# Patient Record
Sex: Male | Born: 1960
Health system: Southern US, Community
[De-identification: ages and names within clinical notes are randomized; demographics above are authoritative.]

## PROBLEM LIST (undated history)

## (undated) DIAGNOSIS — I213 ST elevation (STEMI) myocardial infarction of unspecified site: Secondary | ICD-10-CM

## (undated) DIAGNOSIS — E119 Type 2 diabetes mellitus without complications: Secondary | ICD-10-CM

## (undated) DIAGNOSIS — E785 Hyperlipidemia, unspecified: Secondary | ICD-10-CM

## (undated) DIAGNOSIS — Z8249 Family history of ischemic heart disease and other diseases of the circulatory system: Secondary | ICD-10-CM

## (undated) DIAGNOSIS — I10 Essential (primary) hypertension: Secondary | ICD-10-CM

## (undated) HISTORY — DX: Hyperlipidemia, unspecified: E78.5

## (undated) HISTORY — DX: Essential (primary) hypertension: I10

## (undated) HISTORY — PX: CORONARY STENT PLACEMENT: SHX1402

---

## 2011-12-03 DIAGNOSIS — I2699 Other pulmonary embolism without acute cor pulmonale: Secondary | ICD-10-CM

## 2014-05-11 HISTORY — PX: INGUINAL HERNIA REPAIR: SUR1180

## 2017-06-22 ENCOUNTER — Inpatient Hospital Stay (HOSPITAL_COMMUNITY)
Admission: EM | Disposition: A | Discharge status: Home / Self Care | Payer: Self-pay | Source: Ambulatory Visit | Attending: Cardiovascular Disease

## 2017-06-22 ENCOUNTER — Encounter (HOSPITAL_COMMUNITY): Payer: Self-pay | Admitting: Cardiology

## 2017-06-22 ENCOUNTER — Inpatient Hospital Stay (HOSPITAL_COMMUNITY)
Admission: EM | Admit: 2017-06-22 | Discharge: 2017-06-24 | DRG: 247 | Disposition: A | Discharge status: Home / Self Care | Payer: PRIVATE HEALTH INSURANCE | Source: Ambulatory Visit | Attending: Cardiovascular Disease | Admitting: Cardiovascular Disease

## 2017-06-22 DIAGNOSIS — Z885 Allergy status to narcotic agent status: Secondary | ICD-10-CM

## 2017-06-22 DIAGNOSIS — Z7982 Long term (current) use of aspirin: Secondary | ICD-10-CM

## 2017-06-22 DIAGNOSIS — I2510 Atherosclerotic heart disease of native coronary artery without angina pectoris: Secondary | ICD-10-CM

## 2017-06-22 DIAGNOSIS — Z79899 Other long term (current) drug therapy: Secondary | ICD-10-CM

## 2017-06-22 DIAGNOSIS — I25110 Atherosclerotic heart disease of native coronary artery with unstable angina pectoris: Secondary | ICD-10-CM | POA: Diagnosis present

## 2017-06-22 DIAGNOSIS — E785 Hyperlipidemia, unspecified: Secondary | ICD-10-CM | POA: Diagnosis present

## 2017-06-22 DIAGNOSIS — I2119 ST elevation (STEMI) myocardial infarction involving other coronary artery of inferior wall: Secondary | ICD-10-CM | POA: Diagnosis present

## 2017-06-22 DIAGNOSIS — Z8249 Family history of ischemic heart disease and other diseases of the circulatory system: Secondary | ICD-10-CM | POA: Diagnosis not present

## 2017-06-22 DIAGNOSIS — I2121 ST elevation (STEMI) myocardial infarction involving left circumflex coronary artery: Secondary | ICD-10-CM | POA: Diagnosis not present

## 2017-06-22 DIAGNOSIS — Z955 Presence of coronary angioplasty implant and graft: Secondary | ICD-10-CM

## 2017-06-22 HISTORY — PX: LEFT HEART CATH AND CORONARY ANGIOGRAPHY: CATH118249

## 2017-06-22 HISTORY — PX: CORONARY/GRAFT ACUTE MI REVASCULARIZATION: CATH118305

## 2017-06-22 HISTORY — DX: Hyperlipidemia, unspecified: E78.5

## 2017-06-22 HISTORY — DX: ST elevation (STEMI) myocardial infarction of unspecified site: I21.3

## 2017-06-22 HISTORY — DX: Family history of ischemic heart disease and other diseases of the circulatory system: Z82.49

## 2017-06-22 LAB — COMPREHENSIVE METABOLIC PANEL
ALBUMIN: 4.2 g/dL (ref 3.5–5.0)
ALK PHOS: 60 U/L (ref 38–126)
ALT: 54 U/L (ref 17–63)
AST: 120 U/L — AB (ref 15–41)
Anion gap: 12 (ref 5–15)
BILIRUBIN TOTAL: 0.8 mg/dL (ref 0.3–1.2)
BUN: 9 mg/dL (ref 6–20)
CO2: 20 mmol/L — AB (ref 22–32)
Calcium: 9.4 mg/dL (ref 8.9–10.3)
Chloride: 109 mmol/L (ref 101–111)
Creatinine, Ser: 0.77 mg/dL (ref 0.61–1.24)
GFR calc Af Amer: >60 mL/min (ref >60)
GFR calc non Af Amer: >60 mL/min (ref >60)
Glucose, Bld: 120 mg/dL — ABNORMAL HIGH (ref 65–99)
POTASSIUM: 3.9 mmol/L (ref 3.5–5.1)
SODIUM: 141 mmol/L (ref 135–145)
Total Protein: 7 g/dL (ref 6.5–8.1)

## 2017-06-22 LAB — LIPID PANEL
CHOLESTEROL: 150 mg/dL (ref 0–200)
HDL: 54 mg/dL (ref >40)
LDL Cholesterol: 72 mg/dL (ref 0–99)
TRIGLYCERIDES: 118 mg/dL (ref <150)
Total CHOL/HDL Ratio: 2.8 RATIO
VLDL: 24 mg/dL (ref 0–40)

## 2017-06-22 LAB — HEMOGLOBIN A1C
Hgb A1c MFr Bld: 6.7 % — ABNORMAL HIGH (ref 4.8–5.6)
Mean Plasma Glucose: 145.59 mg/dL

## 2017-06-22 LAB — CBC
HEMATOCRIT: 43.5 % (ref 39.0–52.0)
Hemoglobin: 15.2 g/dL (ref 13.0–17.0)
MCH: 30.4 pg (ref 26.0–34.0)
MCHC: 34.9 g/dL (ref 30.0–36.0)
MCV: 87 fL (ref 78.0–100.0)
Platelets: 235 10*3/uL (ref 150–400)
RBC: 5 MIL/uL (ref 4.22–5.81)
RDW: 13.2 % (ref 11.5–15.5)
WBC: 8.6 10*3/uL (ref 4.0–10.5)

## 2017-06-22 LAB — POCT I-STAT, CHEM 8
BUN: 10 mg/dL (ref 6–20)
CALCIUM ION: 1.24 mmol/L (ref 1.15–1.40)
Chloride: 104 mmol/L (ref 101–111)
Creatinine, Ser: 0.7 mg/dL (ref 0.61–1.24)
GLUCOSE: 186 mg/dL — AB (ref 65–99)
HCT: 43 % (ref 39.0–52.0)
Hemoglobin: 14.6 g/dL (ref 13.0–17.0)
Potassium: 3.8 mmol/L (ref 3.5–5.1)
SODIUM: 140 mmol/L (ref 135–145)
TCO2: 21 mmol/L — AB (ref 22–32)

## 2017-06-22 LAB — TROPONIN I
TROPONIN I: 19.28 ng/mL — AB (ref <0.03)
Troponin I: 38.49 ng/mL (ref <0.03)

## 2017-06-22 LAB — APTT: aPTT: 96 seconds — ABNORMAL HIGH (ref 24–36)

## 2017-06-22 LAB — MRSA PCR SCREENING: MRSA by PCR: NEGATIVE

## 2017-06-22 LAB — PROTIME-INR
INR: 1.06
Prothrombin Time: 13.7 seconds (ref 11.4–15.2)

## 2017-06-22 LAB — POCT ACTIVATED CLOTTING TIME
ACTIVATED CLOTTING TIME: 296 s
Activated Clotting Time: 191 seconds

## 2017-06-22 LAB — BRAIN NATRIURETIC PEPTIDE: B Natriuretic Peptide: 90.1 pg/mL (ref 0.0–100.0)

## 2017-06-22 SURGERY — LEFT HEART CATH AND CORONARY ANGIOGRAPHY
Anesthesia: LOCAL

## 2017-06-22 MED ORDER — TICAGRELOR 90 MG PO TABS
90.0000 mg | ORAL_TABLET | Freq: Two times a day (BID) | ORAL | Status: DC
  Administered 2017-06-22 – 2017-06-24 (×4): 90 mg via ORAL
  Filled 2017-06-22 (×4): qty 1

## 2017-06-22 MED ORDER — HEPARIN SODIUM (PORCINE) 5000 UNIT/ML IJ SOLN
5000.0000 [IU] | Freq: Three times a day (TID) | INTRAMUSCULAR | Status: DC
  Administered 2017-06-22 – 2017-06-24 (×5): 5000 [IU] via SUBCUTANEOUS
  Filled 2017-06-22 (×5): qty 1

## 2017-06-22 MED ORDER — ATORVASTATIN CALCIUM 80 MG PO TABS
80.0000 mg | ORAL_TABLET | Freq: Every day | ORAL | Status: DC
  Administered 2017-06-22 – 2017-06-23 (×2): 80 mg via ORAL
  Filled 2017-06-22 (×2): qty 1

## 2017-06-22 MED ORDER — HEPARIN SODIUM (PORCINE) 1000 UNIT/ML IJ SOLN
INTRAMUSCULAR | Status: AC
  Filled 2017-06-22: qty 1

## 2017-06-22 MED ORDER — ATORVASTATIN CALCIUM 80 MG PO TABS: 80.0000 mg | ORAL_TABLET | Freq: Every day | ORAL | Status: DC

## 2017-06-22 MED ORDER — VERAPAMIL HCL 2.5 MG/ML IV SOLN
INTRAVENOUS | Status: AC
  Filled 2017-06-22: qty 2

## 2017-06-22 MED ORDER — NITROGLYCERIN 1 MG/10 ML FOR IR/CATH LAB
INTRA_ARTERIAL | Status: AC
  Filled 2017-06-22: qty 10

## 2017-06-22 MED ORDER — SODIUM CHLORIDE 0.9% FLUSH: 3.0000 mL | INTRAVENOUS | Status: DC | PRN

## 2017-06-22 MED ORDER — NITROGLYCERIN 0.4 MG SL SUBL: 0.4000 mg | SUBLINGUAL_TABLET | SUBLINGUAL | Status: DC | PRN

## 2017-06-22 MED ORDER — ONDANSETRON HCL 4 MG/2ML IJ SOLN: 4.0000 mg | Freq: Four times a day (QID) | INTRAMUSCULAR | Status: DC | PRN

## 2017-06-22 MED ORDER — ACETAMINOPHEN 325 MG PO TABS: 650.0000 mg | ORAL_TABLET | ORAL | Status: DC | PRN

## 2017-06-22 MED ORDER — TIROFIBAN HCL IN NACL 5-0.9 MG/100ML-% IV SOLN
INTRAVENOUS | Status: AC
  Filled 2017-06-22: qty 100

## 2017-06-22 MED ORDER — TICAGRELOR 90 MG PO TABS
ORAL_TABLET | ORAL | Status: AC
  Filled 2017-06-22: qty 2

## 2017-06-22 MED ORDER — LIDOCAINE HCL (PF) 1 % IJ SOLN
INTRAMUSCULAR | Status: DC | PRN
  Administered 2017-06-22: 2 mL

## 2017-06-22 MED ORDER — SODIUM CHLORIDE 0.9 % WEIGHT BASED INFUSION
1.0000 mL/kg/h | INTRAVENOUS | Status: AC
  Administered 2017-06-22: 1 mL/kg/h via INTRAVENOUS

## 2017-06-22 MED ORDER — IOPAMIDOL (ISOVUE-370) INJECTION 76%
INTRAVENOUS | Status: AC
  Filled 2017-06-22: qty 50

## 2017-06-22 MED ORDER — SODIUM CHLORIDE 0.9 % WEIGHT BASED INFUSION: 1.0000 mL/kg/h | INTRAVENOUS | Status: DC

## 2017-06-22 MED ORDER — IOPAMIDOL (ISOVUE-370) INJECTION 76%
INTRAVENOUS | Status: AC
  Filled 2017-06-22: qty 100

## 2017-06-22 MED ORDER — TICAGRELOR 90 MG PO TABS
ORAL_TABLET | ORAL | Status: DC | PRN
  Administered 2017-06-22: 180 mg via ORAL

## 2017-06-22 MED ORDER — VERAPAMIL HCL 2.5 MG/ML IV SOLN
INTRAVENOUS | Status: DC | PRN
  Administered 2017-06-22: 10 mL via INTRA_ARTERIAL

## 2017-06-22 MED ORDER — SODIUM CHLORIDE 0.9% FLUSH
3.0000 mL | Freq: Two times a day (BID) | INTRAVENOUS | Status: DC
  Administered 2017-06-23 (×2): 3 mL via INTRAVENOUS

## 2017-06-22 MED ORDER — MIDAZOLAM HCL 2 MG/2ML IJ SOLN
INTRAMUSCULAR | Status: DC | PRN
  Administered 2017-06-22: 2 mg via INTRAVENOUS

## 2017-06-22 MED ORDER — FENTANYL CITRATE (PF) 100 MCG/2ML IJ SOLN
INTRAMUSCULAR | Status: DC | PRN
  Administered 2017-06-22: 25 ug via INTRAVENOUS

## 2017-06-22 MED ORDER — HEPARIN (PORCINE) IN NACL 2-0.9 UNIT/ML-% IJ SOLN
INTRAMUSCULAR | Status: AC
  Filled 2017-06-22: qty 1000

## 2017-06-22 MED ORDER — TIROFIBAN (AGGRASTAT) BOLUS VIA INFUSION
INTRAVENOUS | Status: DC | PRN
  Administered 2017-06-22: 2574.175 ug via INTRAVENOUS

## 2017-06-22 MED ORDER — ASPIRIN 81 MG PO CHEW
81.0000 mg | CHEWABLE_TABLET | Freq: Every day | ORAL | Status: DC
  Administered 2017-06-23 – 2017-06-24 (×2): 81 mg via ORAL
  Filled 2017-06-22 (×2): qty 1

## 2017-06-22 MED ORDER — FENTANYL CITRATE (PF) 100 MCG/2ML IJ SOLN
INTRAMUSCULAR | Status: AC
  Filled 2017-06-22: qty 2

## 2017-06-22 MED ORDER — HYDRALAZINE HCL 20 MG/ML IJ SOLN
5.0000 mg | INTRAMUSCULAR | Status: AC | PRN
  Administered 2017-06-22: 5 mg via INTRAVENOUS
  Filled 2017-06-22: qty 1

## 2017-06-22 MED ORDER — HEPARIN (PORCINE) IN NACL 2-0.9 UNIT/ML-% IJ SOLN
INTRAMUSCULAR | Status: AC | PRN
  Administered 2017-06-22: 1000 mL via INTRA_ARTERIAL

## 2017-06-22 MED ORDER — MIDAZOLAM HCL 2 MG/2ML IJ SOLN
INTRAMUSCULAR | Status: AC
  Filled 2017-06-22: qty 2

## 2017-06-22 MED ORDER — IOPAMIDOL (ISOVUE-370) INJECTION 76%
INTRAVENOUS | Status: DC | PRN
  Administered 2017-06-22: 111 mL via INTRA_ARTERIAL

## 2017-06-22 MED ORDER — HEPARIN SODIUM (PORCINE) 1000 UNIT/ML IJ SOLN
INTRAMUSCULAR | Status: DC | PRN
  Administered 2017-06-22: 4000 [IU] via INTRAVENOUS

## 2017-06-22 MED ORDER — LIDOCAINE HCL (PF) 1 % IJ SOLN
INTRAMUSCULAR | Status: AC
  Filled 2017-06-22: qty 30

## 2017-06-22 MED ORDER — LABETALOL HCL 5 MG/ML IV SOLN: 10.0000 mg | INTRAVENOUS | Status: AC | PRN

## 2017-06-22 MED ORDER — SODIUM CHLORIDE 0.9 % IV SOLN: 250.0000 mL | INTRAVENOUS | Status: DC | PRN

## 2017-06-22 MED ORDER — ASPIRIN 325 MG PO TABS
ORAL_TABLET | ORAL | Status: DC | PRN
  Administered 2017-06-22: 325 mg via ORAL

## 2017-06-22 MED ORDER — TIROFIBAN HCL IN NACL 5-0.9 MG/100ML-% IV SOLN
INTRAVENOUS | Status: DC | PRN
  Administered 2017-06-22: 0.075 ug/kg/min via INTRAVENOUS

## 2017-06-22 SURGICAL SUPPLY — 20 items
BALLN SAPPHIRE 2.5X12 (BALLOONS) ×2
BALLOON SAPPHIRE 2.5X12 (BALLOONS) ×1 IMPLANT
CATH 5FR JL3.5 JR4 ANG PIG MP (CATHETERS) ×2 IMPLANT
CATH LAUNCHER 6FR EBU3.5 (CATHETERS) ×2 IMPLANT
DEVICE RAD COMP TR BAND LRG (VASCULAR PRODUCTS) ×2 IMPLANT
ELECT DEFIB PAD ADLT CADENCE (PAD) ×2 IMPLANT
GLIDESHEATH SLEND SS 6F .021 (SHEATH) ×2 IMPLANT
GUIDEWIRE INQWIRE 1.5J.035X260 (WIRE) ×1 IMPLANT
INQWIRE 1.5J .035X260CM (WIRE) ×2
KIT ENCORE 26 ADVANTAGE (KITS) ×2 IMPLANT
KIT HEART LEFT (KITS) ×2 IMPLANT
KIT PREMIUM HAND CONTROLLER (KITS) ×2 IMPLANT
KIT SINGLE USE MANIFOLD (KITS) ×2 IMPLANT
PACK CARDIAC CATHETERIZATION (CUSTOM PROCEDURE TRAY) ×2 IMPLANT
PROTECTION STATION PRESSURIZED (MISCELLANEOUS) ×2
STATION PROTECTION PRESSURIZED (MISCELLANEOUS) ×1 IMPLANT
STENT RESOLUTE ONYX 3.0X15 (Permanent Stent) ×2 IMPLANT
TUBING CIL FLEX 10 FLL-RA (TUBING) ×2 IMPLANT
WIRE COUGAR XT STRL 190CM (WIRE) ×2 IMPLANT
WIRE HI TORQ WHISPER MS 190CM (WIRE) ×2 IMPLANT

## 2017-06-22 NOTE — H&P (Signed)
Cardiology Admission History and Physical:   Patient ID: Daniel Mcgee; MRN: [id number]; DOB: 1960-08-21   Admission date: 06/22/2017  Primary Care Provider: Caryl Bis, MD Primary Cardiologist: Sherren Mocha, MD   Chief Complaint:  Chest Pain; CODE STEMI  Patient Profile:   Daniel Mcgee is a 57 y.o. male with a history of HLD and strong family h/o premature CAD, presenting to [facility] from OSH with chest pain and inferior STEMI.   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 He has a h/o HLD, treated with Zocor. He also has a strong family h/o premature CAD with 2 brothers having MIs. One brother had a MI at age 81. Pt denies any other significant medical history. No h/o HTN, DM, tobacco, stroke or TIA. Only surgical history is right inguinal hernia surgery in 2016. His only known medication allergy is hydrocodone, which causes itching. In addition to Zocor, he takes 81 mg of ASA daily.   He was in his usual state of health until last night, when he developed intermittent left sided chest pain. Pain occurred off and on but resided. He went to see his PCP earlier today and EKG reported showed no acute abnormalities. Pt went home and had recurrent CP, much more severe than the night previous. Described as left sided chest pressure with radiation to bilateral arms and left jaw. No n/v or diaphoresis. Pt went to [facility] and EKG showed inferior ST elevations. CODE STEMI activated and pt transferred to [facility]. He was given 7,500 units of IV heparin, nitro and Tylenol prior to transfer.    On arrival to [facility], pt alert w/ stable vital signs. Still with ongoing CP.    Past Medical History:  Diagnosis Date  . Family history of premature CAD   . HLD (hyperlipidemia)     Past Surgical History:  Procedure Laterality Date  . INGUINAL HERNIA REPAIR Right 2016     Medications Prior to Admission: Prior to Admission medications     Not on File     Allergies:    Allergies  Allergen Reactions  . Hydrocodone Itching    Social History:   Social History   Socioeconomic History  . Marital status: Married    Spouse name: Not on file  . Number of children: Not on file  . Years of education: Not on file  . Highest education level: Not on file  Social Needs  . Financial resource strain: Not on file  . Food insecurity - worry: Not on file  . Food insecurity - inability: Not on file  . Transportation needs - medical: Not on file  . Transportation needs - non-medical: Not on file  Occupational History  . Not on file  Tobacco Use  . Smoking status: Not on file  Substance and Sexual Activity  . Alcohol use: Not on file  . Drug use: Not on file  . Sexual activity: Not on file  Other Topics Concern  . Not on file  Social History Narrative  . Not on file    Family History:   The patient's family history includes CAD in his brother; CAD (age of onset: 69) in his brother.    ROS:  Please see the history of present illness.  All other ROS reviewed and negative.     Physical Exam/Data:  There were no vitals filed for this visit. No intake or output data in the 24 hours ending 06/22/17  1340 There were no vitals filed for this visit. There is no height or weight on file to calculate BMI.  General:  Well nourished, well developed, in no acute distress HEENT: normal Lymph: no adenopathy Neck: no JVD Endocrine:  No thryomegaly Vascular: No carotid bruits; FA pulses 2+ bilaterally without bruits  Cardiac:  normal S1, S2; RRR; no murmur  Lungs:  clear to auscultation bilaterally, no wheezing, rhonchi or rales  Abd: soft, nontender, no hepatomegaly  Ext: no edema Musculoskeletal:  No deformities, BUE and BLE strength normal and equal Skin: warm and dry  Neuro:  CNs 2-12 intact, no focal abnormalities noted Psych:  Normal affect    EKG:  The ECG that was done 06/22/17 was personally reviewed and demonstrates  inferior ST elevations  Relevant CV Studies: Emergent LHC pending  2D Echo pending   Laboratory Data:  ChemistryNo results for input(s): NA, K, CL, CO2, GLUCOSE, BUN, CREATININE, CALCIUM, GFRNONAA, GFRAA, ANIONGAP in the last 168 hours.  No results for input(s): PROT, ALBUMIN, AST, ALT, ALKPHOS, BILITOT in the last 168 hours. HematologyNo results for input(s): WBC, RBC, HGB, HCT, MCV, MCH, MCHC, RDW, PLT in the last 168 hours. Cardiac EnzymesNo results for input(s): TROPONINI in the last 168 hours. No results for input(s): TROPIPOC in the last 168 hours.  BNPNo results for input(s): BNP, PROBNP in the last 168 hours.  DDimer No results for input(s): DDIMER in the last 168 hours.  Radiology/Studies:  No results found.  Assessment and Plan:   1. Acute Inferior STEMI: 2/2 occluded prox-mid LCx, treated with PCI + DES. He will need DAPT x 12 months for DES. ASA 81 mg + Brilinta 90 mg BID. Start BB +/- ACE/ARB if HR and BP allows, + high intensity statin therapy. Check 2D echo. Screen for DM. Check Hgb A1c. Transfer to CCU post cath.  2. HLD: previously on Zocor as outpatient. Given STEMI, will change to Lipitor 80 mg. Check lipid panel and HFTs. Goal LDL < 70 mg/dL.  Severity of Illness: The appropriate patient status for this patient is INPATIENT. Inpatient status is judged to be reasonable and necessary in order to provide the required intensity of service to ensure the patient's safety. The patient's presenting symptoms, physical exam findings, and initial radiographic and laboratory data in the context of their chronic comorbidities is felt to place them at high risk for further clinical deterioration. Furthermore, it is not anticipated that the patient will be medically stable for discharge from the hospital within 2 midnights of admission. The following factors support the patient status of inpatient.   " The patient's presenting symptoms include unstable angina. " The worrisome  physical exam findings include abnormal EKG c/w acute MI " The initial radiographic and laboratory data are worrisome because of abnormal EKG, acute MI. " The chronic co-morbidities include HLD.   * I certify that at the point of admission it is my clinical judgment that the patient will require inpatient hospital care spanning beyond 2 midnights from the point of admission due to high intensity of service, high risk for further deterioration and high frequency of surveillance required.*    For questions or updates, please contact [place] Please consult [email or web address] for contact info under Cardiology/STEMI.   Signed, Lyda Jester, PA-C  06/22/2017 1:40 PM   Patient seen, examined. Available data reviewed. Agree with findings, assessment, and plan as outlined by Lyda Jester, PA-C. Exam reveals an alert, oriented male in NAD. Lungs CTA, heart RRR no murmur,  abdomen soft, NT, ext: no edema, skin W/D. EKG diagnostic of inferolateral STEMI. Plan emergency cath +/- PCI. Discussed with patient who agrees to proceed - emergency implied consent obtained.   Sherren Mocha, M.D. 06/22/2017 2:46 PM

## 2017-06-22 NOTE — Interval H&P Note (Signed)
History and Physical Interval Note:  06/22/2017 2:50 PM  Daniel Mcgee  has presented today for surgery, with the diagnosis of stemi  The various methods of treatment have been discussed with the patient and family. After consideration of risks, benefits and other options for treatment, the patient has consented to  Procedure(s): LEFT HEART CATH AND CORONARY ANGIOGRAPHY (N/A) Coronary/Graft Acute MI Revascularization (N/A) as a surgical intervention .  The patient's history has been reviewed, patient examined, no change in status, stable for surgery.  I have reviewed the patient's chart and labs.  Questions were answered to the patient's satisfaction.     Sherren Mocha

## 2017-06-22 NOTE — H&P (View-Only) (Signed)
Brief Cath Note: Indication: inferolateral STEMI Access: right radial artery Findings: severe LCx stenosis 95% ---> 0 with a 3.0x15 mm Onyx DES LVEF 55%, mild nonobstructive disease in the RCA, left main, and LAD. Complications: none EBL: minimal  Sherren Mocha 06/22/2017 2:49 PM

## 2017-06-22 NOTE — Progress Notes (Signed)
Brief Cath Note: Indication: inferolateral STEMI Access: right radial artery Findings: severe LCx stenosis 95% ---> 0 with a 3.0x15 mm Onyx DES LVEF 55%, mild nonobstructive disease in the RCA, left main, and LAD. Complications: none EBL: minimal  Sherren Mocha 06/22/2017 2:49 PM

## 2017-06-23 ENCOUNTER — Encounter (HOSPITAL_COMMUNITY): Payer: Self-pay | Admitting: Anesthesiology

## 2017-06-23 ENCOUNTER — Inpatient Hospital Stay (HOSPITAL_COMMUNITY): Payer: PRIVATE HEALTH INSURANCE

## 2017-06-23 DIAGNOSIS — I2121 ST elevation (STEMI) myocardial infarction involving left circumflex coronary artery: Secondary | ICD-10-CM

## 2017-06-23 LAB — ECHOCARDIOGRAM COMPLETE
AOASC: 33 cm
E/e' ratio: 10.17
EWDT: 204 ms
FS: 27 % — AB (ref 28–44)
Height: 75 in
IVS/LV PW RATIO, ED: 1.06
LA diam end sys: 41 mm
LA diam index: 1.74 cm/m2
LA vol: 65.5 mL
LASIZE: 41 mm
LAVOLA4C: 69.8 mL
LAVOLIN: 27.8 mL/m2
LV E/e'average: 10.17
LV PW d: 11.1 mm — AB (ref 0.6–1.1)
LV TDI E'LATERAL: 5.87
LV TDI E'MEDIAL: 5.98
LV e' LATERAL: 5.87 cm/s
LVEEMED: 10.17
LVOT area: 4.15 cm2
LVOTD: 23 mm
MV Dec: 204
MV pk A vel: 68.4 m/s
MV pk E vel: 59.7 m/s
RV LATERAL S' VELOCITY: 13.2 cm/s
TAPSE: 16 mm
Weight: 3632 oz

## 2017-06-23 LAB — CBC
HEMATOCRIT: 42.9 % (ref 39.0–52.0)
HEMOGLOBIN: 14.6 g/dL (ref 13.0–17.0)
MCH: 29.9 pg (ref 26.0–34.0)
MCHC: 34 g/dL (ref 30.0–36.0)
MCV: 87.7 fL (ref 78.0–100.0)
Platelets: 215 10*3/uL (ref 150–400)
RBC: 4.89 MIL/uL (ref 4.22–5.81)
RDW: 13.3 % (ref 11.5–15.5)
WBC: 9.9 10*3/uL (ref 4.0–10.5)

## 2017-06-23 LAB — BASIC METABOLIC PANEL
Anion gap: 15 (ref 5–15)
BUN: 10 mg/dL (ref 6–20)
CHLORIDE: 107 mmol/L (ref 101–111)
CO2: 19 mmol/L — ABNORMAL LOW (ref 22–32)
Calcium: 9.1 mg/dL (ref 8.9–10.3)
Creatinine, Ser: 0.9 mg/dL (ref 0.61–1.24)
GFR calc non Af Amer: >60 mL/min (ref >60)
Glucose, Bld: 141 mg/dL — ABNORMAL HIGH (ref 65–99)
POTASSIUM: 3.7 mmol/L (ref 3.5–5.1)
Sodium: 141 mmol/L (ref 135–145)

## 2017-06-23 LAB — LIPID PANEL
CHOLESTEROL: 137 mg/dL (ref 0–200)
HDL: 47 mg/dL (ref >40)
LDL Cholesterol: 69 mg/dL (ref 0–99)
Total CHOL/HDL Ratio: 2.9 RATIO
Triglycerides: 104 mg/dL (ref <150)
VLDL: 21 mg/dL (ref 0–40)

## 2017-06-23 LAB — HIV ANTIBODY (ROUTINE TESTING W REFLEX): HIV SCREEN 4TH GENERATION: NONREACTIVE

## 2017-06-23 LAB — TROPONIN I: TROPONIN I: 23.89 ng/mL — AB (ref <0.03)

## 2017-06-23 LAB — GLUCOSE, CAPILLARY: Glucose-Capillary: 123 mg/dL — ABNORMAL HIGH (ref 65–99)

## 2017-06-23 MED ORDER — METOPROLOL TARTRATE 12.5 MG HALF TABLET
12.5000 mg | ORAL_TABLET | Freq: Two times a day (BID) | ORAL | Status: DC
  Administered 2017-06-23 – 2017-06-24 (×3): 12.5 mg via ORAL
  Filled 2017-06-23 (×3): qty 1

## 2017-06-23 MED ORDER — LIVING WELL WITH DIABETES BOOK
Freq: Once | Status: AC
  Administered 2017-06-23: 16:00:00
  Filled 2017-06-23 (×2): qty 1

## 2017-06-23 MED FILL — Heparin Sodium (Porcine) 2 Unit/ML in Sodium Chloride 0.9%: INTRAMUSCULAR | Qty: 1000 | Status: AC

## 2017-06-23 MED FILL — Nitroglycerin IV Soln 100 MCG/ML in D5W: INTRA_ARTERIAL | Qty: 10 | Status: AC

## 2017-06-23 NOTE — Progress Notes (Signed)
NUTRITION NOTE   RD consulted for nutrition education regarding new onset diabetes with HgbA1c: 6.7%.   Lab Results  Component Value Date   HGBA1C 6.7 (H) 06/22/2017    RD provided "Carbohydrate Counting for People with Diabetes" handout from the Academy of Nutrition and Dietetics. Discussed different food groups and their effects on blood sugar, emphasizing carbohydrate-containing foods. Provided list of carbohydrates and recommended serving sizes of common foods.   Discussed importance of controlled and consistent carbohydrate intake throughout the day. Provided examples of ways to balance meals/snacks and encouraged intake of high-fiber, whole grain complex carbohydrates. Teach back method used.  Pt states that he works at the [employer] and has an annual physical and that A1c has slowly been increasing over the past few years. 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 Due to this, he is familiar with DM and carbohydrates. He eats chicken often for protein, does not like bread, and eats 3 servings of fruit several days a week. Pt appreciative of information and denies questions or concerns at this time but is aware of ability to alert RN or MD to have RD return to further discuss this topic.   Expect good compliance.  Body mass index is 28.37 kg/m. Pt meets criteria for overweight based on current BMI.  Current diet order is Heart Healthy, patient is consuming approximately 100% of meals at this time. Labs and medications reviewed. No further nutrition interventions warranted at this time. RD contact information provided. If additional nutrition issues arise, please re-consult RD.     Jarome Matin, MS, RD, LDN, [facility] Inpatient Clinical Dietitian Pager # [phone] After hours/weekend pager # [phone]

## 2017-06-23 NOTE — Progress Notes (Signed)
[id number] Pt had walked with his daughter without CP and tolerated well so I did not walk with pt. MI education completed with pt and family who voiced understanding. Reviewed MI restrictions, NTG use, importance of brilinta with stent, ex ed, heart healthy food choices and watching carbs since A1C at 6.7. Discussed CRP 2 and referring to [facility] program. Needs to see case manager re brilinta.  Graylon Good RN BSN 06/23/2017 12:23 PM

## 2017-06-23 NOTE — Progress Notes (Addendum)
Inpatient Diabetes Program Recommendations  AACE/ADA: New Consensus Statement on Inpatient Glycemic Control (2015)  Target Ranges:  Prepandial:   less than 140 mg/dL      Peak postprandial:   less than 180 mg/dL (1-2 hours)      Critically ill patients:  140 - 180 mg/dL   Lab Results  Component Value Date   HGBA1C 6.7 (H) 06/22/2017    Review of Glycemic Control  Diabetes history: No prior hx  Inpatient Diabetes Program Recommendations:   Received diabetes consult regarding new onset diabetes. A1c 6.7. Ordered Living well with Diabetes book, patient education videos, and dietician consult. Will speak with patient. 4:00 pm Spoke with pt about new diagnosis. Discussed A1C results with them and explained what an A1C is, basic pathophysiology of DM Type 2, basic home care, basic diabetes diet nutrition principles, importance of checking CBGs and maintaining good CBG control to prevent long-term and short-term complications. Reviewed signs and symptoms of hyperglycemia and hypoglycemia and how to treat hypoglycemia at home. Also reviewed blood sugar goals at home.  RNs to provide ongoing basic DM education at bedside with this patient.  Patient shared that his diagnosis of diabetes along with having CABG was overwhelming. Reviewed basics with patient and reinforced to followup with PCP. Discharge Needs: -Glucometer and strips #[id number]  Thank you, Nani Gasser. Kier Smead, RN, MSN, CDE  Diabetes Coordinator Inpatient Glycemic Control Team Team Pager [phone] (8am-5pm) 06/23/2017 10:04 AM

## 2017-06-23 NOTE — Progress Notes (Signed)
Per insurance check for [employer]  # 4 S/W RACHEL @  [employer] # [phone]    BRILINTA 90 MG BID  COVER- YES  CO-PAY- $ 35.00  TIER- 2 DRUG  PRIOR APPROVAL- NO   PHARMACY : ANY RETAIL

## 2017-06-23 NOTE — Progress Notes (Signed)
  Echocardiogram 2D Echocardiogram has been performed.  Daniel Mcgee 06/23/2017, 1:16 PM

## 2017-06-23 NOTE — Progress Notes (Signed)
Progress Note  Patient Name: Daniel Mcgee Date of Encounter: 06/23/2017  Primary Cardiologist: Sherren Mocha, MD   Subjective   Denies chest pain or shortness of breath.  Eager to start walking around the unit.  Inpatient Medications    Scheduled Meds: . aspirin  81 mg Oral Daily  . atorvastatin  80 mg Oral q1800  . heparin  5,000 Units Subcutaneous Q8H  . sodium chloride flush  3 mL Intravenous Q12H  . ticagrelor  90 mg Oral BID   Continuous Infusions: . sodium chloride     PRN Meds: sodium chloride, acetaminophen, nitroGLYCERIN, ondansetron (ZOFRAN) IV, sodium chloride flush   Vital Signs    Vitals:   06/23/17 0300 06/23/17 0400 06/23/17 0417 06/23/17 0500  BP: 123/83 121/80  117/79  Pulse: 64 (!) 59  (!) 58  Resp: 10 10  17   Temp:   98.2 F (36.8 C)   TempSrc:   Oral   SpO2: 98% 98%  96%  Weight:      Height:        Intake/Output Summary (Last 24 hours) at 06/23/2017 0609 Last data filed at 06/23/2017 0100 Gross per 24 hour  Intake 1024.68 ml  Output 2950 ml  Net -1925.32 ml   Filed Weights   06/22/17 1600  Weight: 103 kg (227 lb)    Telemetry    Non sustained vtach and pvcs- Personally Reviewed  ECG    NSR with non-specific ST abnormalities - Personally Reviewed  Physical Exam   GEN: No acute distress.   Neck: No JVD Cardiac: RRR, no murmurs, rubs, or gallops.  Respiratory: Clear to auscultation bilaterally. GI: Soft, nontender, non-distended  MS: No edema; No deformity. Right radial site appears well healing, minimal swelling Neuro:  Nonfocal  Psych: Normal affect   Labs    Chemistry Recent Labs  Lab 06/22/17 1330 06/22/17 1616 06/23/17 0241  NA 140 141 141  K 3.8 3.9 3.7  CL 104 109 107  CO2  --  20* 19*  GLUCOSE 186* 120* 141*  BUN 10 9 10   CREATININE 0.70 0.77 0.90  CALCIUM  --  9.4 9.1  PROT  --  7.0  --   ALBUMIN  --  4.2  --   AST  --  120*  --   ALT  --  54  --   ALKPHOS  --  60  --   BILITOT  --  0.8  --     GFRNONAA  --  >60 >60  GFRAA  --  >60 >60  ANIONGAP  --  12 15     Hematology Recent Labs  Lab 06/22/17 1330 06/22/17 1616 06/23/17 0241  WBC  --  8.6 9.9  RBC  --  5.00 4.89  HGB 14.6 15.2 14.6  HCT 43.0 43.5 42.9  MCV  --  87.0 87.7  MCH  --  30.4 29.9  MCHC  --  34.9 34.0  RDW  --  13.2 13.3  PLT  --  235 215    Cardiac Enzymes Recent Labs  Lab 06/22/17 1616 06/22/17 2123 06/23/17 0241  TROPONINI 19.28* 38.49* 23.89*   No results for input(s): TROPIPOC in the last 168 hours.   BNP Recent Labs  Lab 06/22/17 1631  BNP 90.1     DDimer No results for input(s): DDIMER in the last 168 hours.   Radiology    No results found.  Cardiac Studies   Left Heart Cath 2/12  Left Main The  vessel exhibits minimal luminal irregularities. Left Anterior Descending There is mild diffuse disease throughout the vessel. Left Circumflex Prox Cx lesion 99% stenosed Prox Cx lesion is 99% stenosed. TIMI-3 flow Right Coronary Artery There is mild diffuse disease throughout the vessel.  Patient Profile     57 y.o. male  with a history of HLD and strong family h/o premature CAD, presenting to [facility] from OSH with chest pain and inferior STEMI.    Assessment & Plan    1. Inferior lateral STEMI s/p DES to Proximal Cx Started on ASA and Brinlinta.  Will need DAPT for 12 months.  Will start beta blocker low dose beta blocker.  Troponins 19>>38>>23.  Echo pending.  Hgb A1C 6.7  2. HLD On zocor as outpatient.  This has been switched to lipitor 80mg .  Lipid panel with LDL 69 and at goal.   For questions or updates, please contact [place] Please consult [email or web address] for contact info under Cardiology/STEMI.      Signed, Boyd Kerbs, DO  06/23/2017, 6:09 AM    Agree with note by Dr. Kalman Shan  Postop day one inferolateral STEMI treated with PCI and drug-eluting stenting of the circumflex coronary artery by [name]. His troponins peaked at 38. A 2-D echo  is pending. He is asymptomatic. He has been walking around the unit without limitation. He denies chest pain. We will transfer him to telemetry today with anticipation of discharge home tomorrow.  Lorretta Harp, M.D., [place], [facility], [place] [address] [place], [place]  [id number]  [phone] 06/23/2017 10:13 AM

## 2017-06-24 ENCOUNTER — Encounter (HOSPITAL_COMMUNITY): Payer: Self-pay | Admitting: Cardiology

## 2017-06-24 DIAGNOSIS — E785 Hyperlipidemia, unspecified: Secondary | ICD-10-CM

## 2017-06-24 LAB — BASIC METABOLIC PANEL
ANION GAP: 14 (ref 5–15)
BUN: 12 mg/dL (ref 6–20)
CO2: 21 mmol/L — ABNORMAL LOW (ref 22–32)
Calcium: 9.5 mg/dL (ref 8.9–10.3)
Chloride: 105 mmol/L (ref 101–111)
Creatinine, Ser: 0.99 mg/dL (ref 0.61–1.24)
GFR calc Af Amer: >60 mL/min (ref >60)
GFR calc non Af Amer: >60 mL/min (ref >60)
GLUCOSE: 128 mg/dL — AB (ref 65–99)
POTASSIUM: 3.9 mmol/L (ref 3.5–5.1)
Sodium: 140 mmol/L (ref 135–145)

## 2017-06-24 MED ORDER — TICAGRELOR 90 MG PO TABS: 90.0000 mg | ORAL_TABLET | Freq: Two times a day (BID) | ORAL | 2 refills | Status: DC

## 2017-06-24 MED ORDER — METOPROLOL TARTRATE 25 MG PO TABS: 12.5000 mg | ORAL_TABLET | Freq: Two times a day (BID) | ORAL | 1 refills | Status: DC

## 2017-06-24 MED ORDER — NITROGLYCERIN 0.4 MG SL SUBL: 0.4000 mg | SUBLINGUAL_TABLET | SUBLINGUAL | 2 refills | Status: DC | PRN

## 2017-06-24 MED ORDER — ATORVASTATIN CALCIUM 80 MG PO TABS: 80.0000 mg | ORAL_TABLET | Freq: Every day | ORAL | 1 refills | Status: DC

## 2017-06-24 NOTE — Care Management (Addendum)
06-24-17 [place], RN, BSN [phone] CM did provide pt with co pay card for Brilinta. Pt is aware of co- pay and to call Brilinta Assist to see if 30 day free card is applicable. [facility] in [place] has medication in stock. No further needs from CM @ this time.

## 2017-06-24 NOTE — Discharge Summary (Signed)
Discharge Summary    Patient ID: Daniel Mcgee,  MRN: [id number], DOB/AGE: 1961-04-08 57 y.o.  Admit date: 06/22/2017 Discharge date: 06/24/2017  Primary Care Provider: Caryl Mcgee Primary Cardiologist: Dr. Burt Mcgee   Discharge Diagnoses    Active Problems:   STEMI involving left circumflex coronary artery Daniel Mcgee)   Daniel elevation myocardial infarction (STEMI) of inferior wall (HCC)   Hyperlipidemia   Allergies Allergies  Allergen Reactions  . Hydrocodone Itching    Diagnostic Studies/Procedures    Cath: 06/22/17  Conclusion   1.  Acute STEMI of the inferolateral wall secondary to critical stenosis of the left circumflex, treated successfully with primary PCI using a 3.0 x 15 mm Onyx DES 2.  Mild nonobstructive disease of the LAD and RCA 3.  Mild segmental contraction abnormality of the left ventricle with preserved overall LVEF estimated at 55%  Recommendations: Dual antiplatelet therapy without interruption using aspirin and ticagrelor for a minimum of 12 months.  Post MI medical therapy will be initiated.  Consider Mcgee discharge 24-48 hours.   TTE: 06/23/17  Study Conclusions  - Left ventricle: The cavity size was normal. Wall thickness was   normal. Systolic function was normal. The estimated ejection   fraction was in the range of 60% to 65%. Wall motion was normal;   there were no regional wall motion abnormalities. Features are   consistent with a pseudonormal left ventricular filling pattern,   with concomitant abnormal relaxation and increased filling   pressure (grade 2 diastolic dysfunction). 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 He has a h/o HLD, treated with Zocor. He also has a strong family h/o premature CAD with 2 brothers having MIs. One brother had a MI at age 52. Pt denies any other significant medical history. No h/o HTN, DM, tobacco, stroke or TIA. Only surgical history  is right inguinal hernia surgery in 2016. His only known medication allergy is hydrocodone, which caused itching. In addition to Zocor, he takes 81 mg of ASA daily.   He was in his usual state of health until the night prior to admission, when he developed intermittent left sided chest pain. Pain occurred off and on but resided. He went to see his PCP earlier the following day and EKG reported showed no acute abnormalities. Pt went home and had recurrent CP, much more severe than the night previous. Described as left sided chest pressure with radiation to bilateral arms and left jaw. No n/v or diaphoresis. Pt went to Daniel Mcgee and EKG showed inferior Daniel elevations. CODE STEMI activated and pt transferred to Daniel Mcgee. He was given 7,500 units of IV heparin, nitro and Tylenol prior to transfer.    On arrival to Daniel Mcgee, pt alert w/ stable vital signs. He was transferred to the Cath lab for emergent cardiac cath.    Mcgee Course     Underwent cardiac cath noted above with PCI/DES x1 to the Lcx, with mild nonobstructive CAD in the LAD and RCA. Plan for DAPT with ASA/Brilinta for at least one year. Follow up echo showed EF 60-65% with no WMA, and G2DD. Troponin peaked at 38.49. LDL 69. He was started on high dose statin with zocor stopped and BB therapy. Hgb A1c 6.7. Seen by the diabetes coordinator and nutrition educator. Worked well with cardiac rehab without recurrent chest pain.    Daniel Mcgee was seen by Dr. Gwenlyn Mcgee and determined stable for discharge  home. Follow up in the office has been arranged. Medications are listed below.   _____________  Discharge Vitals Blood pressure 129/86, pulse 63, temperature 98 F (36.7 C), temperature source Oral, resp. rate 18, height 6\' 3"  (1.905 m), weight 221 lb 12.8 oz (100.6 kg), SpO2 91 %.  Filed Weights   06/22/17 1600 06/24/17 0638  Weight: 227 lb (103 kg) 221 lb 12.8 oz (100.6 kg)    Labs & Radiologic Studies    CBC Recent Labs    06/22/17 1616  06/23/17 0241  WBC 8.6 9.9  HGB 15.2 14.6  HCT 43.5 42.9  MCV 87.0 87.7  PLT 235 948   Basic Metabolic Panel Recent Labs    06/23/17 0241 06/24/17 0531  NA 141 140  K 3.7 3.9  CL 107 105  CO2 19* 21*  GLUCOSE 141* 128*  BUN 10 12  CREATININE 0.90 0.99  CALCIUM 9.1 9.5   Liver Function Tests Recent Labs    06/22/17 1616  AST 120*  ALT 54  ALKPHOS 60  BILITOT 0.8  PROT 7.0  ALBUMIN 4.2   No results for input(s): LIPASE, AMYLASE in the last 72 hours. Cardiac Enzymes Recent Labs    06/22/17 1616 06/22/17 2123 06/23/17 0241  TROPONINI 19.28* 38.49* 23.89*   BNP Invalid input(s): POCBNP D-Dimer No results for input(s): DDIMER in the last 72 hours. Hemoglobin A1C Recent Labs    06/22/17 1631  HGBA1C 6.7*   Fasting Lipid Panel Recent Labs    06/23/17 0241  CHOL 137  HDL 47  LDLCALC 69  TRIG 104  CHOLHDL 2.9   Thyroid Function Tests No results for input(s): TSH, T4TOTAL, T3FREE, THYROIDAB in the last 72 hours.  Invalid input(s): FREET3 _____________  No results Mcgee. Disposition   Pt is being discharged home today in good condition.  Follow-up Plans & Appointments    Follow-up Information    Daniel Burn, PA-C Follow up on 07/12/2017.   Specialty:  Cardiology Why:  March 4th at 11:30am Contact information: [place] [address] [phone]          Discharge Instructions    Amb Referral to Cardiac Rehabilitation   Complete by:  As directed    Diagnosis:   STEMI Coronary Stents     Call MD for:  redness, tenderness, or signs of infection (pain, swelling, redness, odor or green/yellow discharge around incision site)   Complete by:  As directed    Diet - low sodium heart healthy   Complete by:  As directed    Discharge instructions   Complete by:  As directed    Radial Site Care Refer to this sheet in the next few weeks. These instructions provide you with information on caring for yourself after your procedure.  Your caregiver may also give you more specific instructions. Your treatment has been planned according to current medical practices, but problems sometimes occur. Call your caregiver if you have any problems or questions after your procedure. HOME CARE INSTRUCTIONS You may shower the day after the procedure.Remove the bandage (dressing) and gently wash the site with plain soap and water.Gently pat the site dry.  Do not apply powder or lotion to the site.  Do not submerge the affected site in water for 3 to 5 days.  Inspect the site at least twice daily.  Do not flex or bend the affected arm for 24 hours.  No lifting over 5 pounds (2.3 kg) for 5 days after your  procedure.  Do not drive home if you are discharged the same day of the procedure. Have someone else drive you.  You may drive 24 hours after the procedure unless otherwise instructed by your caregiver.  What to expect: Any bruising will usually fade within 1 to 2 weeks.  Blood that collects in the tissue (hematoma) may be painful to the touch. It should usually decrease in size and tenderness within 1 to 2 weeks.  SEEK IMMEDIATE MEDICAL CARE IF: You have unusual pain at the radial site.  You have redness, warmth, swelling, or pain at the radial site.  You have drainage (other than a small amount of blood on the dressing).  You have chills.  You have a fever or persistent symptoms for more than 72 hours.  You have a fever and your symptoms suddenly get worse.  Your arm becomes pale, cool, tingly, or numb.  You have heavy bleeding from the site. Hold pressure on the site.   PLEASE DO NOT MISS ANY DOSES OF YOUR BRILINTA!!!!! Also keep a log of you blood pressures and bring back to your follow up appt. Please call the office with any questions.   Patients taking blood thinners should generally stay away from medicines like ibuprofen, Advil, Motrin, naproxen, and Aleve due to risk of stomach bleeding. You may take Tylenol as directed or  talk to your primary doctor about alternatives.   Increase activity slowly   Complete by:  As directed       Discharge Medications     Medication List    STOP taking these medications   simvastatin 40 MG tablet Commonly known as:  ZOCOR     TAKE these medications   aspirin EC 81 MG tablet Take 81 mg by mouth daily.   atorvastatin 80 MG tablet Commonly known as:  LIPITOR Take 1 tablet (80 mg total) by mouth daily at 6 PM.   metoprolol tartrate 25 MG tablet Commonly known as:  LOPRESSOR Take 0.5 tablets (12.5 mg total) by mouth 2 (two) times daily.   nitroGLYCERIN 0.4 MG SL tablet Commonly known as:  NITROSTAT Place 1 tablet (0.4 mg total) under the tongue every 5 (five) minutes x 3 doses as needed for chest pain.   ticagrelor 90 MG Tabs tablet Commonly known as:  BRILINTA Take 1 tablet (90 mg total) by mouth 2 (two) times daily.        Aspirin prescribed at discharge?  Yes High Intensity Statin Prescribed? (Lipitor 40-80mg  or Crestor 20-40mg ): Yes Beta Blocker Prescribed? Yes For EF <40%, was ACEI/ARB Prescribed? No: EF ok ADP Receptor Inhibitor Prescribed? (i.e. Plavix etc.-Includes Medically Managed Patients): Yes For EF <40%, Aldosterone Inhibitor Prescribed? No: EF ok Was EF assessed during THIS hospitalization? Yes Was Cardiac Rehab II ordered? (Included Medically managed Patients): Yes   Outstanding Labs/Studies   FLP/LFTs in 6 weeks if tolerating statin change.   Duration of Discharge Encounter   Greater than 30 minutes including physician time.  Signed, Reino Bellis NP-C 06/24/2017, 10:32 AM

## 2017-06-24 NOTE — Progress Notes (Signed)
Progress Note  Patient Name: Daniel Mcgee Date of Encounter: 06/24/2017  Primary Cardiologist: Sherren Mocha, MD   Subjective   States he feels great this morning.  Denies chest pain or shortness of breath.   Inpatient Medications    Scheduled Meds: . aspirin  81 mg Oral Daily  . atorvastatin  80 mg Oral q1800  . heparin  5,000 Units Subcutaneous Q8H  . metoprolol tartrate  12.5 mg Oral BID  . sodium chloride flush  3 mL Intravenous Q12H  . ticagrelor  90 mg Oral BID   Continuous Infusions: . sodium chloride     PRN Meds: sodium chloride, acetaminophen, nitroGLYCERIN, ondansetron (ZOFRAN) IV, sodium chloride flush   Vital Signs    Vitals:   06/23/17 1146 06/23/17 1443 06/23/17 2108 06/23/17 2142  BP:  124/83 127/83   Pulse:   65 65  Resp:   18   Temp: 98.9 F (37.2 C) 97.8 F (36.6 C) 97.9 F (36.6 C)   TempSrc: Oral Oral Oral   SpO2:  96% 94%   Weight:      Height:        Intake/Output Summary (Last 24 hours) at 06/24/2017 0533 Last data filed at 06/23/2017 2140 Gross per 24 hour  Intake 240 ml  Output 650 ml  Net -410 ml   Filed Weights   06/22/17 1600  Weight: 103 kg (227 lb)    Telemetry    Sinus brady, pvcs- Personally Reviewed  ECG    NSR with non-specific ST abnormalities - Personally Reviewed  Physical Exam   GEN: No acute distress.   Neck: No JVD Cardiac: RRR, no murmurs, rubs, or gallops.  Respiratory: Clear to auscultation bilaterally. GI: Soft, nontender, non-distended  MS: No edema; No deformity. Right radial site appears well healing, minimal swelling Neuro:  Nonfocal  Psych: Normal affect   Labs    Chemistry Recent Labs  Lab 06/22/17 1330 06/22/17 1616 06/23/17 0241  NA 140 141 141  K 3.8 3.9 3.7  CL 104 109 107  CO2  --  20* 19*  GLUCOSE 186* 120* 141*  BUN 10 9 10   CREATININE 0.70 0.77 0.90  CALCIUM  --  9.4 9.1  PROT  --  7.0  --   ALBUMIN  --  4.2  --   AST  --  120*  --   ALT  --  54  --   ALKPHOS   --  60  --   BILITOT  --  0.8  --   GFRNONAA  --  >60 >60  GFRAA  --  >60 >60  ANIONGAP  --  12 15     Hematology Recent Labs  Lab 06/22/17 1330 06/22/17 1616 06/23/17 0241  WBC  --  8.6 9.9  RBC  --  5.00 4.89  HGB 14.6 15.2 14.6  HCT 43.0 43.5 42.9  MCV  --  87.0 87.7  MCH  --  30.4 29.9  MCHC  --  34.9 34.0  RDW  --  13.2 13.3  PLT  --  235 215    Cardiac Enzymes Recent Labs  Lab 06/22/17 1616 06/22/17 2123 06/23/17 0241  TROPONINI 19.28* 38.49* 23.89*   No results for input(s): TROPIPOC in the last 168 hours.   BNP Recent Labs  Lab 06/22/17 1631  BNP 90.1     DDimer No results for input(s): DDIMER in the last 168 hours.   Radiology    No results found.  Cardiac Studies  Left Heart Cath 2/12  Left Main The vessel exhibits minimal luminal irregularities. Left Anterior Descending There is mild diffuse disease throughout the vessel. Left Circumflex Prox Cx lesion 99% stenosed Prox Cx lesion is 99% stenosed. TIMI-3 flow Right Coronary Artery There is mild diffuse disease throughout the vessel.  Patient Profile     57 y.o. male  with a history of HLD and strong family h/o premature CAD, presenting to [facility] from OSH with chest pain and inferior STEMI.    Assessment & Plan    1. Inferior lateral STEMI s/p DES to Proximal Cx Started on ASA and Brinlinta and will need DAPT for 12 months.  On low dose beta blocker and heart rates in the 60s and tolerating.  Troponins 19>>38>>23.  LV EF: 60-65%, wall motion was normal and grade 2 diastolic dysfunction.  Hgb A1C 6.7 and diabetic coordinator and nutritional counseling saw patient yesterday.  Plan for discharge to home today.  2. HLD On zocor as outpatient.  This has been switched to lipitor 80mg .  Lipid panel with LDL 69 and at goal.   For questions or updates, please contact [place] Please consult [email or web address] for contact info under Cardiology/STEMI.      Signed, Boyd Kerbs, DO    06/24/2017, 5:33 AM    Agree with note by Dr. Kalman Shan  Daniel Mcgee is postop day 2 inferolateral STEMI treated with PCI and drug-eluting stenting by [name] of the circumflex obtuse marginal branch performed radially. His peak troponin was 38. His LV function is normal. He is very active and is ambulating in the hallway. He is on appropriate medications. He has had no arrhythmias on telemetry. He stable for discharge home today.  Lorretta Harp, M.D., Agency, [facility], [place] [address] [place], [place]  [address]  [phone] 06/24/2017 9:50 AM

## 2017-06-28 ENCOUNTER — Ambulatory Visit (INDEPENDENT_AMBULATORY_CARE_PROVIDER_SITE_OTHER): Payer: PRIVATE HEALTH INSURANCE | Admitting: Internal Medicine

## 2017-06-28 ENCOUNTER — Encounter: Payer: Self-pay | Admitting: Internal Medicine

## 2017-06-28 VITALS — BP 126/84 | HR 62 | Ht 75.0 in | Wt 221.0 lb

## 2017-06-28 DIAGNOSIS — E782 Mixed hyperlipidemia: Secondary | ICD-10-CM

## 2017-06-28 DIAGNOSIS — I2121 ST elevation (STEMI) myocardial infarction involving left circumflex coronary artery: Secondary | ICD-10-CM

## 2017-06-28 NOTE — Progress Notes (Signed)
Cardiology Office Note   Date:  06/28/2017   ID:  [name], DOB February 22, 1961, MRN [id number]  PCP:  Caryl Bis, MD  Cardiologist:   Dorris Carnes, MD   F/U of CAD      History of Present Illness: Daniel Mcgee is a 57 y.o. male with a history of CAD   The pt just left the hospital 1 week ago  He presented to the hosp with sudden onset intermitt chest pain  Seen by PCP  Francia Greaves e  Had more tightness in chest and arms and jaw  Went to [facility]  EKG showed Inferior ST elevation  Transferred to [facility] showed:    He underwent PTCA/CEs to LCx   LAD and RCA had no signif dz   Placed on ASA and Brilinta for at least 1 year   LDL was 69  He wasa placed on high does statin  Echo showed LVEF 60 to 65% Since d/c the pt denies CP  Breathing is OK   He noted a small knot in R wrist  No pain         Current Meds  Medication Sig  . aspirin EC 81 MG tablet Take 81 mg by mouth daily.  Marland Kitchen atorvastatin (LIPITOR) 80 MG tablet Take 1 tablet (80 mg total) by mouth daily at 6 PM.  . metoprolol tartrate (LOPRESSOR) 25 MG tablet Take 0.5 tablets (12.5 mg total) by mouth 2 (two) times daily.  . nitroGLYCERIN (NITROSTAT) 0.4 MG SL tablet Place 1 tablet (0.4 mg total) under the tongue every 5 (five) minutes x 3 doses as needed for chest pain.  . ticagrelor (BRILINTA) 90 MG TABS tablet Take 1 tablet (90 mg total) by mouth 2 (two) times daily.     Allergies:   Hydrocodone   Past Medical History:  Diagnosis Date  . Family history of premature CAD   . HLD (hyperlipidemia)   . STEMI (ST elevation myocardial infarction) ([place])    2/19 PCI/DES x1 to Lcx, mild disease in RCA, LAD, normal EF    Past Surgical History:  Procedure Laterality Date  . CORONARY/GRAFT ACUTE MI REVASCULARIZATION N/A 06/22/2017   Procedure: Coronary/Graft Acute MI Revascularization;  Surgeon: Sherren Mocha, MD;  Location: [place] [facility];  Service: Cardiovascular;  Laterality: N/A;  . INGUINAL HERNIA  REPAIR Right 2016  . LEFT HEART CATH AND CORONARY ANGIOGRAPHY N/A 06/22/2017   Procedure: LEFT HEART CATH AND CORONARY ANGIOGRAPHY;  Surgeon: Sherren Mocha, MD;  Location: [place] [facility];  Service: Cardiovascular;  Laterality: N/A;     Social History:  The patient  reports that  has never smoked. He quit smokeless tobacco use about 33 years ago. His smokeless tobacco use included chew.   Family History:  The patient's family history includes CAD in his brother; CAD (age of onset: 84) in his brother.    ROS:  Please see the history of present illness. All other systems are reviewed and  Negative to the above problem except as noted.    PHYSICAL EXAM: VS:  BP 126/84   Pulse 62   Ht 6\' 3"  (1.905 m)   Wt 221 lb (100.2 kg)   SpO2 97%   BMI 27.62 kg/m   GEN: Well nourished, well developed, in no acute distress  HEENT: normal  Neck: no JVD, carotid bruits, or masses Cardiac: RRR; no murmurs, rubs, or gallops,no edema  Respiratory:  clear to auscultation bilaterally, normal work of  breathing GI: soft, nontender, nondistended, + BS  No hepatomegaly  MS: no deformity Moving all extremities    R wrist:  Soft  Small nodulre consistent with tiny hematoma  nontender  No bruit   Skin: warm and dry, no rash  Mild bruising R forearm   Neuro:  Strength and sensation are intact Psych: euthymic mood, full affect   EKG:  EKG is ordered today.   Lipid Panel    Component Value Date/Time   CHOL 137 06/23/2017 0241   TRIG 104 06/23/2017 0241   HDL 47 06/23/2017 0241   CHOLHDL 2.9 06/23/2017 0241   VLDL 21 06/23/2017 0241   LDLCALC 69 06/23/2017 0241      Wt Readings from Last 3 Encounters:  06/28/17 221 lb (100.2 kg)  06/24/17 221 lb 12.8 oz (100.6 kg)      ASSESSMENT AND PLAN:  1  CAD  Pt is s/p STEMI 1 wk ago  S/P PTCA/DES to LCx  Doing well  He was concerned about wrist   I think it looks good  Keep on same meds  2  Lipids   Baseline lipids were good  But would push with  high dose statin  Should prob have lipiomed to help guide therapy  WIll make appt for him to be seen in [place] clinic    Current medicines are reviewed at length with the patient today.  The patient does not have concerns regarding medicines.  Signed, Dorris Carnes, MD  06/28/2017 1:38 PM    [facility] [place], [place], [place]  [address] Phone: [phone]; Fax: [phone]

## 2017-06-28 NOTE — Patient Instructions (Signed)
Your physician recommends that you schedule a follow-up appointment in: 3-4 weeks in [place] recommends that you continue on your current medications as directed. Please refer to the Current Medication list given to you today.    If you need a refill on your cardiac medications before your next appointment, please call your pharmacy.     No tests ordered today      Thank you for choosing [facility] !

## 2017-07-12 ENCOUNTER — Ambulatory Visit: Payer: PRIVATE HEALTH INSURANCE | Admitting: Physician Assistant

## 2017-07-22 ENCOUNTER — Encounter (HOSPITAL_COMMUNITY)
Admission: RE | Admit: 2017-07-22 | Discharge: 2017-07-22 | Disposition: A | Discharge status: Home / Self Care | Payer: PRIVATE HEALTH INSURANCE | Source: Ambulatory Visit | Attending: Cardiovascular Disease | Admitting: Cardiovascular Disease

## 2017-07-22 ENCOUNTER — Encounter (HOSPITAL_COMMUNITY): Payer: Self-pay

## 2017-07-22 VITALS — BP 122/82 | HR 78 | Ht 75.0 in | Wt 212.4 lb

## 2017-07-22 DIAGNOSIS — I213 ST elevation (STEMI) myocardial infarction of unspecified site: Secondary | ICD-10-CM | POA: Insufficient documentation

## 2017-07-22 DIAGNOSIS — I2121 ST elevation (STEMI) myocardial infarction involving left circumflex coronary artery: Secondary | ICD-10-CM

## 2017-07-22 DIAGNOSIS — Z87891 Personal history of nicotine dependence: Secondary | ICD-10-CM | POA: Diagnosis not present

## 2017-07-22 DIAGNOSIS — Z79899 Other long term (current) drug therapy: Secondary | ICD-10-CM | POA: Diagnosis not present

## 2017-07-22 DIAGNOSIS — Z7902 Long term (current) use of antithrombotics/antiplatelets: Secondary | ICD-10-CM | POA: Diagnosis not present

## 2017-07-22 DIAGNOSIS — Z8249 Family history of ischemic heart disease and other diseases of the circulatory system: Secondary | ICD-10-CM | POA: Diagnosis not present

## 2017-07-22 DIAGNOSIS — E785 Hyperlipidemia, unspecified: Secondary | ICD-10-CM | POA: Insufficient documentation

## 2017-07-22 DIAGNOSIS — Z955 Presence of coronary angioplasty implant and graft: Secondary | ICD-10-CM | POA: Diagnosis not present

## 2017-07-22 DIAGNOSIS — Z7982 Long term (current) use of aspirin: Secondary | ICD-10-CM | POA: Insufficient documentation

## 2017-07-22 NOTE — Progress Notes (Signed)
Cardiac Individual Treatment Plan  Patient Details  Name: Daniel Mcgee MRN: [id number] Date of Birth: August 14, 1960 Referring Provider:     CARDIAC REHAB PHASE II ORIENTATION from 07/22/2017 in [place]  Referring Provider  [name]       Initial Encounter Date:    CARDIAC REHAB PHASE II ORIENTATION from 07/22/2017 in [place]  Date  07/22/17  Referring Provider  [name]       Visit Diagnosis: ST elevation myocardial infarction involving left circumflex coronary artery [facility])  Status post coronary artery stent placement  Patient's Home Medications on Admission:  Current Outpatient Medications:  .  aspirin EC 81 MG tablet, Take 81 mg by mouth daily., Disp: , Rfl:  .  atorvastatin (LIPITOR) 80 MG tablet, Take 1 tablet (80 mg total) by mouth daily at 6 PM., Disp: 90 tablet, Rfl: 1 .  metoprolol tartrate (LOPRESSOR) 25 MG tablet, Take 0.5 tablets (12.5 mg total) by mouth 2 (two) times daily., Disp: 60 tablet, Rfl: 1 .  nitroGLYCERIN (NITROSTAT) 0.4 MG SL tablet, Place 1 tablet (0.4 mg total) under the tongue every 5 (five) minutes x 3 doses as needed for chest pain., Disp: 25 tablet, Rfl: 2 .  ticagrelor (BRILINTA) 90 MG TABS tablet, Take 1 tablet (90 mg total) by mouth 2 (two) times daily., Disp: 180 tablet, Rfl: 2  Past Medical History: Past Medical History:  Diagnosis Date  . Family history of premature CAD   . HLD (hyperlipidemia)   . STEMI (ST elevation myocardial infarction) ([place])    2/19 PCI/DES x1 to Lcx, mild disease in RCA, LAD, normal EF    Tobacco Use: Social History   Tobacco Use  Smoking Status Never Smoker  Smokeless Tobacco Former Systems developer  . Types: Chew    Labs: Recent Review Flowsheet Data    Labs for ITP Cardiac and Pulmonary Rehab Latest Ref Rng & Units 06/22/2017 06/23/2017   Cholestrol 0 - 200 mg/dL 150 137   LDLCALC 0 - 99 mg/dL 72 69   HDL >40 mg/dL 54 47   Trlycerides <150 mg/dL 118 104    Hemoglobin A1c 4.8 - 5.6 % 6.7(H) -   TCO2 22 - 32 mmol/L 21(L) -      Capillary Blood Glucose: Lab Results  Component Value Date   GLUCAP 123 (H) 06/23/2017     Exercise Target Goals: Date: 07/22/17  Exercise Program Goal: Individual exercise prescription set using results from initial 6 min walk test and THRR while considering  patient's activity barriers and safety.   Exercise Prescription Goal: Starting with aerobic activity 30 plus minutes a day, 3 days per week for initial exercise prescription. Provide home exercise prescription and guidelines that participant acknowledges understanding prior to discharge.  Activity Barriers & Risk Stratification: Activity Barriers & Cardiac Risk Stratification - 07/22/17 1357      Activity Barriers & Cardiac Risk Stratification   Activity Barriers  None    Cardiac Risk Stratification  High       6 Minute Walk: 6 Minute Walk    Row Name 07/22/17 1357         6 Minute Walk   Phase  Initial     Distance  1450 feet     Distance % Change  0 %     Distance Feet Change  0 ft     Walk Time  6 minutes     # of Rest Breaks  0  MPH  2.74     METS  3.1     RPE  9     Perceived Dyspnea   8     VO2 Peak  15.8     Symptoms  No     Resting HR  78 bpm     Resting BP  122/82     Resting Oxygen Saturation   97 %     Exercise Oxygen Saturation  during 6 min walk  90 %     Max Ex. HR  120 bpm     Max Ex. BP  160/86     2 Minute Post BP  124/76        Oxygen Initial Assessment:   Oxygen Re-Evaluation:   Oxygen Discharge (Final Oxygen Re-Evaluation):   Initial Exercise Prescription: Initial Exercise Prescription - 07/22/17 1500      Elliptical   Level  --    Speed  --    Minutes  --    METs  --      Prescription Details   Frequency (times per week)  --    Duration  --      Intensity   THRR 40-80% of Max Heartrate  --    Ratings of Perceived Exertion  --    Perceived Dyspnea  --      Progression   Progression   --      Resistance Training   Training Prescription  --    Weight  --    Reps  --       Perform Capillary Blood Glucose checks as needed.  Exercise Prescription Changes:   Exercise Comments:   Exercise Goals and Review:  Exercise Goals    Row Name 07/22/17 1359             Exercise Goals   Increase Physical Activity  Yes       Intervention  Provide advice, education, support and counseling about physical activity/exercise needs.;Develop an individualized exercise prescription for aerobic and resistive training based on initial evaluation findings, risk stratification, comorbidities and participant's personal goals.       Expected Outcomes  Short Term: Attend rehab on a regular basis to increase amount of physical activity.       Increase Strength and Stamina  Yes       Intervention  Develop an individualized exercise prescription for aerobic and resistive training based on initial evaluation findings, risk stratification, comorbidities and participant's personal goals.;Provide advice, education, support and counseling about physical activity/exercise needs.       Expected Outcomes  Short Term: Increase workloads from initial exercise prescription for resistance, speed, and METs.       Able to understand and use rate of perceived exertion (RPE) scale  Yes       Intervention  Provide education and explanation on how to use RPE scale       Expected Outcomes  Short Term: Able to use RPE daily in rehab to express subjective intensity level;Long Term:  Able to use RPE to guide intensity level when exercising independently       Able to understand and use Dyspnea scale  Yes       Intervention  Provide education and explanation on how to use Dyspnea scale       Expected Outcomes  Short Term: Able to use Dyspnea scale daily in rehab to express subjective sense of shortness of breath during exertion;Long Term: Able to use Dyspnea scale to guide intensity  level when exercising  independently       Knowledge and understanding of Target Heart Rate Range (THRR)  Yes       Intervention  Provide education and explanation of THRR including how the numbers were predicted and where they are located for reference       Expected Outcomes  Long Term: Able to use THRR to govern intensity when exercising independently;Short Term: Able to state/look up THRR;Short Term: Able to use daily as guideline for intensity in rehab       Able to check pulse independently  Yes       Intervention  Provide education and demonstration on how to check pulse in carotid and radial arteries.;Review the importance of being able to check your own pulse for safety during independent exercise       Expected Outcomes  Short Term: Able to explain why pulse checking is important during independent exercise;Long Term: Able to check pulse independently and accurately       Understanding of Exercise Prescription  Yes       Intervention  Provide education, explanation, and written materials on patient's individual exercise prescription       Expected Outcomes  Short Term: Able to explain program exercise prescription;Long Term: Able to explain home exercise prescription to exercise independently          Exercise Goals Re-Evaluation :    Discharge Exercise Prescription (Final Exercise Prescription Changes):   Nutrition:  Target Goals: Understanding of nutrition guidelines, daily intake of sodium 1500mg , cholesterol 200mg , calories 30% from fat and 7% or less from saturated fats, daily to have 5 or more servings of fruits and vegetables.  Biometrics: Pre Biometrics - 07/22/17 1400      Pre Biometrics   Height  6\' 3"  (1.905 m)    Weight  212 lb 6.4 oz (96.3 kg)    Waist Circumference  37.5 inches    Hip Circumference  40 inches    Waist to Hip Ratio  0.94 %    BMI (Calculated)  26.55    Triceps Skinfold  6 mm    % Body Fat  21.3 %    Grip Strength  67.4 kg    Flexibility  18.83 in    Single Leg  Stand  23 seconds        Nutrition Therapy Plan and Nutrition Goals: Nutrition Therapy & Goals - 07/22/17 1526      Personal Nutrition Goals   Personal Goal #2  Patient has completely changed his diet. He is eating heart healthy. No salt, non fat products, no fired food, and lean meats.     Additional Goals?  No      Intervention Plan   Expected Outcomes  Short Term Goal: Understand basic principles of dietary content, such as calories, fat, sodium, cholesterol and nutrients.;Long Term Goal: Adherence to prescribed nutrition plan.       Nutrition Assessments: Nutrition Assessments - 07/22/17 1528      MEDFICTS Scores   Pre Score  6       Nutrition Goals Re-Evaluation:   Nutrition Goals Discharge (Final Nutrition Goals Re-Evaluation):   Psychosocial: Target Goals: Acknowledge presence or absence of significant depression and/or stress, maximize coping skills, provide positive support system. Participant is able to verbalize types and ability to use techniques and skills needed for reducing stress and depression.  Initial Review & Psychosocial Screening: Initial Psych Review & Screening - 07/22/17 1531      Initial Review  Current issues with  None Identified      Family Dynamics   Good Support System?  Yes      Barriers   Psychosocial barriers to participate in program  There are no identifiable barriers or psychosocial needs.      Screening Interventions   Interventions  Encouraged to exercise    Expected Outcomes  Short Term goal: Identification and review with participant of any Quality of Life or Depression concerns found by scoring the questionnaire.;Long Term goal: The participant improves quality of Life and PHQ9 Scores as seen by post scores and/or verbalization of changes       Quality of Life Scores: Quality of Life - 07/22/17 1401      Quality of Life Scores   Health/Function Pre  28.4 %    Socioeconomic Pre  28.29 %    Psych/Spiritual Pre  30 %     Family Pre  27.6 %    GLOBAL Pre  28.59 %      Scores of 19 and below usually indicate a poorer quality of life in these areas.  A difference of  2-3 points is a clinically meaningful difference.  A difference of 2-3 points in the total score of the Quality of Life Index has been associated with significant improvement in overall quality of life, self-image, physical symptoms, and general health in studies assessing change in quality of life.  PHQ-9: Recent Review Flowsheet Data    Depression screen [facility] 2/9 07/22/2017   Decreased Interest 0   Down, Depressed, Hopeless 0   PHQ - 2 Score 0   Altered sleeping 0   Tired, decreased energy 0   Change in appetite 0   Feeling bad or failure about yourself  0   Trouble concentrating 0   Moving slowly or fidgety/restless 0   Suicidal thoughts 0   PHQ-9 Score 0     Interpretation of Total Score  Total Score Depression Severity:  1-4 = Minimal depression, 5-9 = Mild depression, 10-14 = Moderate depression, 15-19 = Moderately severe depression, 20-27 = Severe depression   Psychosocial Evaluation and Intervention: Psychosocial Evaluation - 07/22/17 1531      Psychosocial Evaluation & Interventions   Interventions  Encouraged to exercise with the program and follow exercise prescription    Continue Psychosocial Services   No Follow up required       Psychosocial Re-Evaluation:   Psychosocial Discharge (Final Psychosocial Re-Evaluation):   Vocational Rehabilitation: Provide vocational rehab assistance to qualifying candidates.   Vocational Rehab Evaluation & Intervention: Vocational Rehab - 07/22/17 1516      Initial Vocational Rehab Evaluation & Intervention   Assessment shows need for Vocational Rehabilitation  No       Education: Education Goals: Education classes will be provided on a weekly basis, covering required topics. Participant will state understanding/return demonstration of topics presented.  Learning  Barriers/Preferences: Learning Barriers/Preferences - 07/22/17 1515      Learning Barriers/Preferences   Learning Barriers  Hearing    Learning Preferences  Skilled Demonstration;Individual Instruction;Group Instruction       Education Topics: Hypertension, Hypertension Reduction -Define heart disease and high blood pressure. Discus how high blood pressure affects the body and ways to reduce high blood pressure.   Exercise and Your Heart -Discuss why it is important to exercise, the FITT principles of exercise, normal and abnormal responses to exercise, and how to exercise safely.   Angina -Discuss definition of angina, causes of angina, treatment of angina, and  how to decrease risk of having angina.   Cardiac Medications -Review what the following cardiac medications are used for, how they affect the body, and side effects that may occur when taking the medications.  Medications include Aspirin, Beta blockers, calcium channel blockers, ACE Inhibitors, angiotensin receptor blockers, diuretics, digoxin, and antihyperlipidemics.   Congestive Heart Failure -Discuss the definition of CHF, how to live with CHF, the signs and symptoms of CHF, and how keep track of weight and sodium intake.   Heart Disease and Intimacy -Discus the effect sexual activity has on the heart, how changes occur during intimacy as we age, and safety during sexual activity.   Smoking Cessation / COPD -Discuss different methods to quit smoking, the health benefits of quitting smoking, and the definition of COPD.   Nutrition I: Fats -Discuss the types of cholesterol, what cholesterol does to the heart, and how cholesterol levels can be controlled.   Nutrition II: Labels -Discuss the different components of food labels and how to read food label   Heart Parts/Heart Disease and PAD -Discuss the anatomy of the heart, the pathway of blood circulation through the heart, and these are affected by heart  disease.   Stress I: Signs and Symptoms -Discuss the causes of stress, how stress may lead to anxiety and depression, and ways to limit stress.   Stress II: Relaxation -Discuss different types of relaxation techniques to limit stress.   Warning Signs of Stroke / TIA -Discuss definition of a stroke, what the signs and symptoms are of a stroke, and how to identify when someone is having stroke.   Knowledge Questionnaire Score: Knowledge Questionnaire Score - 07/22/17 1515      Knowledge Questionnaire Score   Pre Score  22/24       Core Components/Risk Factors/Patient Goals at Admission: Personal Goals and Risk Factors at Admission - 07/22/17 1528      Core Components/Risk Factors/Patient Goals on Admission    Weight Management  Weight Maintenance    Personal Goal Other  Yes    Personal Goal  To get back to work.     Intervention  Attend CR program 3 x week and supplement with home exercise 2 x week.     Expected Outcomes  Reach personal goals.        Core Components/Risk Factors/Patient Goals Review:    Core Components/Risk Factors/Patient Goals at Discharge (Final Review):    ITP Comments:   Comments: Patient arrived for 1st visit/orientation/education at 1230. Patient was referred to CR by [name] due to STEMI (I21.21) and Stent Placement (Z95.5). During orientation advised patient on arrival and appointment times what to wear, what to do before, during and after exercise. Reviewed attendance and class policy. Talked about inclement weather and class consultation policy. Pt is scheduled to return Cardiac Rehab on 07/23/17 at 11:00. Pt was advised to come to class 15 minutes before class starts. Patient was also given instructions on meeting with the dietician and attending the Family Structure classes. Discussed RPE/Dpysnea scales. Discussed initial THR and how to find their radial and/or carotid pulse. Discussed the initial exercise prescription and how this effects  their progress. Pt is eager to get started. Patient participated in warm up stretches followed by light weights and resistance bands. Patient was able to complete 6 minute walk test. Patient did not c/o pain. Patient was measured for the equipment. Discussed equipment safety with patient. Took patient pre-anthropometric measurements. Patient finished visit at 1500.

## 2017-07-22 NOTE — Progress Notes (Signed)
Daily Session Note  Patient Details  Name: Daniel Mcgee MRN: [id number] Date of Birth: June 14, 1960 Referring Provider:     CARDIAC REHAB PHASE II ORIENTATION from 07/22/2017 in [place]  Referring Provider  [name]       Encounter Date: 07/22/2017  Check In: Session Check In - 07/22/17 1230      Check-In   Location  [facility]    Staff Present  Odalys Win Angelina Pih, MS, EP, [facility], Exercise Physiologist;Gregory Luther Parody, BS, EP, Exercise Physiologist;Debra Wynetta Emery, RN, BSN    Supervising physician immediately available to respond to emergencies  See telemetry face sheet for immediately available MD    Medication changes reported      No    Fall or balance concerns reported     No    Tobacco Cessation  -- Never smoked    Warm-up and Cool-down  Performed as group-led instruction    Resistance Training Performed  Yes    VAD Patient?  No      Pain Assessment   Currently in Pain?  No/denies    Pain Score  0-No pain    Multiple Pain Sites  No       Capillary Blood Glucose: No results found for this or any previous visit (from the past 24 hour(s)).    Social History   Tobacco Use  Smoking Status Never Smoker  Smokeless Tobacco Former Systems developer  . Types: Chew    Goals Met:  Independence with exercise equipment Exercise tolerated well No report of cardiac concerns or symptoms Strength training completed today  Goals Unmet:  Not Applicable  Comments: Check out: 1500   [name] is Medical Director for [place] and Pulmonary Rehab.

## 2017-07-22 NOTE — Progress Notes (Addendum)
Cardiac/Pulmonary Rehab Medication Review by a Pharmacist  Does the patient  feel that his/her medications are working for him/her?  yes  Has the patient been experiencing any side effects to the medications prescribed?  no  Does the patient measure his/her own blood pressure or blood glucose at home?  yes   Does the patient have any problems obtaining medications due to transportation or finances?   no  Understanding of regimen: excellent Understanding of indications: good Potential of compliance: excellent  Questions asked to Determine Patient Understanding of Medication Regimen:  1. What is the name of the medication?  2. What is the medication used for?  3. When should it be taken?  4. How much should be taken?  5. How will you take it?  6. What side effects should you report?  Understanding Defined as: Excellent: All questions above are correct Good: Questions 1-4 are correct Fair: Questions 1-2 are correct  Poor: 1 or none of the above questions are correct   Pharmacist comments: 57 yo male with recent cardiac stents placed, presents today to cardiac rehab. Patient knowledgeable about medication regimen and is tolerating. He monitors his BP and HR at home. No concerns regarding medications at this point.  Thanks for the opportunity to participate in the care of this patient,  Daniel Mcgee, BS [place]ena [place], [place] Pharmacist Pager [phone] 07/22/2017 2:17 PM

## 2017-07-23 ENCOUNTER — Encounter (HOSPITAL_COMMUNITY)
Admission: RE | Admit: 2017-07-23 | Discharge: 2017-07-23 | Disposition: A | Discharge status: Home / Self Care | Payer: PRIVATE HEALTH INSURANCE | Source: Ambulatory Visit | Attending: Cardiovascular Disease | Admitting: Cardiovascular Disease

## 2017-07-23 DIAGNOSIS — I2121 ST elevation (STEMI) myocardial infarction involving left circumflex coronary artery: Secondary | ICD-10-CM

## 2017-07-23 DIAGNOSIS — Z955 Presence of coronary angioplasty implant and graft: Secondary | ICD-10-CM

## 2017-07-23 DIAGNOSIS — I213 ST elevation (STEMI) myocardial infarction of unspecified site: Secondary | ICD-10-CM | POA: Diagnosis not present

## 2017-07-23 NOTE — Progress Notes (Signed)
Daily Session Note  Patient Details  Name: Daniel Mcgee MRN: [id number] Date of Birth: Feb 24, 1961 Referring Provider:     [place] from 07/22/2017 in [place]  Referring Provider  [name]       Encounter Date: 07/23/2017  Check In: Session Check In - 07/23/17 1055      Check-In   Location  [facility]    Staff Present  Diane Angelina Pih, MS, EP, [facility], Exercise Physiologist;Briarrose Shor Luther Parody, BS, EP, Exercise Physiologist;Debra Wynetta Emery, RN, BSN    Supervising physician immediately available to respond to emergencies  See telemetry face sheet for immediately available MD    Medication changes reported      No    Fall or balance concerns reported     No    Tobacco Cessation  No Change    Warm-up and Cool-down  Performed as group-led instruction    Resistance Training Performed  Yes    VAD Patient?  No      Pain Assessment   Currently in Pain?  No/denies    Pain Score  0-No pain    Multiple Pain Sites  No       Capillary Blood Glucose: No results found for this or any previous visit (from the past 24 hour(s)).    Social History   Tobacco Use  Smoking Status Never Smoker  Smokeless Tobacco Former Systems developer  . Types: Chew    Goals Met:  Independence with exercise equipment Exercise tolerated well No report of cardiac concerns or symptoms Strength training completed today  Goals Unmet:  Not Applicable  Comments: Check out 1200   [name] is Medical Director for [place] and Pulmonary Rehab.

## 2017-07-26 ENCOUNTER — Telehealth: Payer: Self-pay

## 2017-07-26 ENCOUNTER — Encounter (HOSPITAL_COMMUNITY)
Admission: RE | Admit: 2017-07-26 | Discharge: 2017-07-26 | Disposition: A | Discharge status: Home / Self Care | Payer: PRIVATE HEALTH INSURANCE | Source: Ambulatory Visit | Attending: Cardiovascular Disease | Admitting: Cardiovascular Disease

## 2017-07-26 DIAGNOSIS — I2121 ST elevation (STEMI) myocardial infarction involving left circumflex coronary artery: Secondary | ICD-10-CM

## 2017-07-26 DIAGNOSIS — Z955 Presence of coronary angioplasty implant and graft: Secondary | ICD-10-CM

## 2017-07-26 DIAGNOSIS — I213 ST elevation (STEMI) myocardial infarction of unspecified site: Secondary | ICD-10-CM | POA: Diagnosis not present

## 2017-07-26 NOTE — Progress Notes (Signed)
Daily Session Note  Patient Details  Name: Daniel Mcgee MRN: [id number] Date of Birth: [birth year] Referring Provider:     CARDIAC REHAB PHASE II ORIENTATION from 07/22/2017 in [place]  Referring Provider  [name]       Encounter Date: 07/26/2017  Check In: Session Check In - 07/26/17 1100      Check-In   Location  [facility]    Staff Present  Diane Angelina Pih, MS, EP, [facility], Exercise Physiologist;Gregory Luther Parody, BS, EP, Exercise Physiologist;Nalla Purdy Wynetta Emery, RN, BSN    Supervising physician immediately available to respond to emergencies  See telemetry face sheet for immediately available MD    Medication changes reported      No    Fall or balance concerns reported     No    Warm-up and Cool-down  Performed as group-led instruction    Resistance Training Performed  Yes    VAD Patient?  No      Pain Assessment   Currently in Pain?  No/denies    Pain Score  0-No pain    Multiple Pain Sites  No       Capillary Blood Glucose: No results found for this or any previous visit (from the past 24 hour(s)).    Social History   Tobacco Use  Smoking Status Never Smoker  Smokeless Tobacco Former Systems developer  . Types: Chew    Goals Met:  Independence with exercise equipment Exercise tolerated well No report of cardiac concerns or symptoms Strength training completed today  Goals Unmet:  Not Applicable  Comments: Check out 1200.   [name] is Medical Director for [facility] Cardiac and Pulmonary Rehab.

## 2017-07-26 NOTE — Telephone Encounter (Signed)
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 Paperwork filled out and signed by [name] with return to work date 6/5. The patient understands Dr. Bronson Ing can edit dates if necessary. He was grateful for call and agrees with treatment plan.

## 2017-07-28 ENCOUNTER — Encounter: Payer: Self-pay | Admitting: Cardiovascular Disease

## 2017-07-28 ENCOUNTER — Other Ambulatory Visit: Payer: Self-pay

## 2017-07-28 ENCOUNTER — Encounter (HOSPITAL_COMMUNITY)
Admission: RE | Admit: 2017-07-28 | Discharge: 2017-07-28 | Disposition: A | Discharge status: Home / Self Care | Payer: PRIVATE HEALTH INSURANCE | Source: Ambulatory Visit | Attending: Cardiovascular Disease | Admitting: Cardiovascular Disease

## 2017-07-28 ENCOUNTER — Ambulatory Visit (INDEPENDENT_AMBULATORY_CARE_PROVIDER_SITE_OTHER): Payer: PRIVATE HEALTH INSURANCE | Admitting: Cardiovascular Disease

## 2017-07-28 VITALS — BP 115/75 | HR 65 | Ht 75.0 in | Wt 216.0 lb

## 2017-07-28 DIAGNOSIS — I2121 ST elevation (STEMI) myocardial infarction involving left circumflex coronary artery: Secondary | ICD-10-CM | POA: Diagnosis not present

## 2017-07-28 DIAGNOSIS — E785 Hyperlipidemia, unspecified: Secondary | ICD-10-CM | POA: Diagnosis not present

## 2017-07-28 DIAGNOSIS — Z955 Presence of coronary angioplasty implant and graft: Secondary | ICD-10-CM

## 2017-07-28 DIAGNOSIS — I213 ST elevation (STEMI) myocardial infarction of unspecified site: Secondary | ICD-10-CM | POA: Diagnosis not present

## 2017-07-28 MED ORDER — METOPROLOL TARTRATE 25 MG PO TABS: 12.5000 mg | ORAL_TABLET | Freq: Two times a day (BID) | ORAL | 1 refills | Status: DC

## 2017-07-28 NOTE — Patient Instructions (Signed)
Your physician recommends that you schedule a follow-up appointment in: 3 MONTHS WITH DR KONESWARAN  Your physician recommends that you continue on your current medications as directed. Please refer to the Current Medication list given to you today.  Thank you for choosing [facility]!!   

## 2017-07-28 NOTE — Progress Notes (Signed)
Daily Session Note  Patient Details  Name: Daniel Mcgee MRN: [id number] Date of Birth: [birth year] Referring Provider:     CARDIAC REHAB PHASE II ORIENTATION from 07/22/2017 in [place]  Referring Provider  [name]       Encounter Date: 07/28/2017  Check In: Session Check In - 07/28/17 1518      Check-In   Location  [facility]    Staff Present  Diane Angelina Pih, MS, EP, [facility], Exercise Physiologist;Franciszek Platten Wynetta Emery, RN, BSN    Supervising physician immediately available to respond to emergencies  See telemetry face sheet for immediately available MD    Medication changes reported      No    Fall or balance concerns reported     No    Warm-up and Cool-down  Performed as group-led instruction    Resistance Training Performed  Yes    VAD Patient?  No      Pain Assessment   Currently in Pain?  No/denies    Pain Score  0-No pain    Multiple Pain Sites  No       Capillary Blood Glucose: No results found for this or any previous visit (from the past 24 hour(s)).    Social History   Tobacco Use  Smoking Status Never Smoker  Smokeless Tobacco Former Systems developer  . Types: Chew    Goals Met:  Independence with exercise equipment Exercise tolerated well No report of cardiac concerns or symptoms Strength training completed today  Goals Unmet:  Not Applicable  Comments: Check out 1645.   [name] is Medical Director for [facility] Cardiac and Pulmonary Rehab.

## 2017-07-28 NOTE — Addendum Note (Signed)
Addended by: Julian Hy T on: 07/28/2017 11:10 AM   Modules accepted: Orders

## 2017-07-28 NOTE — Progress Notes (Signed)
SUBJECTIVE: The patient presents to establish care in our [place] office.  This is my first time meeting him.  Daniel Mcgee sustained an acute STEMI of the inferolateral wall secondary to critical stenosis of the left circumflex, treated successfully with primary PCI using a drug-eluting stent on 06/22/17.  There was mild nonobstructive disease of the LAD and RCA.  Echocardiogram on 06/23/17 demonstrated normal left ventricular systolic function and regional wall motion, LVEF 60-65%, with grade 2 diastolic dysfunction.  Lipid panel 06/23/17: Total cholesterol 137, triglycerides 104, HDL 47, LDL 69.  Daniel Mcgee told me that his brother died of a heart attack at the age of 31.  Dr. Quillian Quince put him on aspirin and Lipitor several years ago.  Daniel Mcgee did not tolerate Lipitor due to diffuse joint pains and then was tried on Crestor and finally Zocor 40 mg.  Daniel Mcgee said Daniel Mcgee was switched to different statins over the course of about 20 years.  The patient denies any symptoms of chest pain, palpitations, shortness of breath, lightheadedness, dizziness, leg swelling, orthopnea, PND, and syncope.  Daniel Mcgee said Daniel Mcgee is lost 21 pounds since January 1.  Daniel Mcgee is completed 3 cardiac rehabilitation sessions.  I reviewed the cardiac rehabilitation report which Daniel Mcgee brought with him.  Daniel Mcgee has some sinus congestion but denies fevers.  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    Review of Systems: As per "subjective", otherwise negative.  Allergies  Allergen Reactions  . Hydrocodone Itching    Current Outpatient Medications  Medication Sig Dispense Refill  . aspirin EC 81 MG tablet Take 81 mg by mouth daily.    Marland Kitchen atorvastatin (LIPITOR) 80 MG tablet Take 1 tablet (80 mg total) by mouth daily at 6 PM. 90 tablet 1  . metoprolol tartrate (LOPRESSOR) 25 MG tablet Take 0.5 tablets (12.5 mg total) by mouth 2 (two) times daily. 60 tablet 1  . nitroGLYCERIN (NITROSTAT) 0.4 MG SL tablet Place 1 tablet (0.4 mg total) under the tongue every 5 (five) minutes x  3 doses as needed for chest pain. 25 tablet 2  . ticagrelor (BRILINTA) 90 MG TABS tablet Take 1 tablet (90 mg total) by mouth 2 (two) times daily. 180 tablet 2   No current facility-administered medications for this visit.     Past Medical History:  Diagnosis Date  . Family history of premature CAD   . HLD (hyperlipidemia)   . STEMI (ST elevation myocardial infarction) ([place])    2/19 PCI/DES x1 to Lcx, mild disease in RCA, LAD, normal EF    Past Surgical History:  Procedure Laterality Date  . CORONARY STENT PLACEMENT    . CORONARY/GRAFT ACUTE MI REVASCULARIZATION N/A 06/22/2017   Procedure: Coronary/Graft Acute MI Revascularization;  Surgeon: Sherren Mocha, MD;  Location: [facility];  Service: Cardiovascular;  Laterality: N/A;  . INGUINAL HERNIA REPAIR Right 2016  . LEFT HEART CATH AND CORONARY ANGIOGRAPHY N/A 06/22/2017   Procedure: LEFT HEART CATH AND CORONARY ANGIOGRAPHY;  Surgeon: Sherren Mocha, MD;  Location: [facility];  Service: Cardiovascular;  Laterality: N/A;    Social History   Socioeconomic History  . Marital status: Married    Spouse name: Not on file  . Number of children: Not on file  . Years of education: Not on file  . Highest education level: Not on file  Social Needs  . Financial resource strain: Not on file  . Food insecurity - worry: Not on file  . Food insecurity -  inability: Not on file  . Transportation needs - medical: Not on file  . Transportation needs - non-medical: Not on file  Occupational History  . Occupation: Teacher, English as a foreign language: [employer]  Tobacco Use  . Smoking status: Never Smoker  . Smokeless tobacco: Former Systems developer    Types: Chew  Substance and Sexual Activity  . Alcohol use: Yes    Alcohol/week: 0.6 oz    Types: 1 Cans of beer per week  . Drug use: No  . Sexual activity: Not on file  Other Topics Concern  . Not on file  Social History Narrative  . Not on file     Vitals:   07/28/17 1030  BP:  115/75  Pulse: 65  SpO2: 97%  Weight: 216 lb (98 kg)  Height: 6\' 3"  (1.905 m)    Wt Readings from Last 3 Encounters:  07/28/17 216 lb (98 kg)  07/22/17 212 lb 6.4 oz (96.3 kg)  06/28/17 221 lb (100.2 kg)     PHYSICAL EXAM General: NAD HEENT: Normal. Neck: No JVD, no thyromegaly. Lungs: Clear to auscultation bilaterally with normal respiratory effort. CV: Regular rate and rhythm, normal S1/S2, no S3/S4, no murmur. No pretibial or periankle edema.  No carotid bruit.   Abdomen: Soft, nontender, no distention.  Neurologic: Alert and oriented.  Psych: Normal affect. Skin: Normal. Musculoskeletal: No gross deformities.    ECG: Most recent ECG reviewed.   Labs: Lab Results  Component Value Date/Time   K 3.9 06/24/2017 05:31 AM   BUN 12 06/24/2017 05:31 AM   CREATININE 0.99 06/24/2017 05:31 AM   ALT 54 06/22/2017 04:16 PM   HGB 14.6 06/23/2017 02:41 AM     Lipids: Lab Results  Component Value Date/Time   LDLCALC 69 06/23/2017 02:41 AM   CHOL 137 06/23/2017 02:41 AM   TRIG 104 06/23/2017 02:41 AM   HDL 47 06/23/2017 02:41 AM       ASSESSMENT AND PLAN: 1.  Inferolateral STEMI status post drug-eluting stent placement to the left circumflex coronary artery: Symptomatically stable.  Continue dual antiplatelet therapy for a minimum of 1 year with aspirin and Brilinta 90 mg twice daily.  Continue metoprolol tartrate 12.5 mg twice daily along with Lipitor 80 mg.  Daniel Mcgee intends to complete 12 weeks of cardiac debilitation.  2. Hyperlipidemia: Lipids reviewed above.  Continue high intensity statin therapy with Lipitor 80 mg.  Daniel Mcgee did have diffuse joint pains in the past with Lipitor, Crestor, and Zocor.  Daniel Mcgee said it takes several years for the symptoms to develop.  Should Daniel Mcgee develop them in the future, I would consider pitavastatin.    Disposition: Follow up mid June 2019   Kate Sable, M.D., F.A.C.C.

## 2017-07-30 ENCOUNTER — Encounter (HOSPITAL_COMMUNITY)
Admission: RE | Admit: 2017-07-30 | Discharge: 2017-07-30 | Disposition: A | Discharge status: Home / Self Care | Payer: PRIVATE HEALTH INSURANCE | Source: Ambulatory Visit | Attending: Cardiovascular Disease | Admitting: Cardiovascular Disease

## 2017-07-30 DIAGNOSIS — I2121 ST elevation (STEMI) myocardial infarction involving left circumflex coronary artery: Secondary | ICD-10-CM

## 2017-07-30 DIAGNOSIS — Z955 Presence of coronary angioplasty implant and graft: Secondary | ICD-10-CM

## 2017-07-30 DIAGNOSIS — I213 ST elevation (STEMI) myocardial infarction of unspecified site: Secondary | ICD-10-CM | POA: Diagnosis not present

## 2017-07-30 NOTE — Progress Notes (Signed)
Daily Session Note  Patient Details  Name: ANDRANIK JEUNE MRN: [id number] Date of Birth: October 22, 1960 Referring Provider:     CARDIAC REHAB PHASE II ORIENTATION from 07/22/2017 in [facility]  Referring Provider  [name]       Encounter Date: 07/30/2017  Check In: Session Check In - 07/30/17 1100      Check-In   Location  [facility]    Staff Present  Diane Angelina Pih, MS, EP, [facility], Exercise Physiologist;Kember Boch Wynetta Emery, RN, BSN    Supervising physician immediately available to respond to emergencies  See telemetry face sheet for immediately available MD    Medication changes reported      No    Fall or balance concerns reported     No    Warm-up and Cool-down  Performed as group-led instruction    Resistance Training Performed  Yes    VAD Patient?  No      Pain Assessment   Currently in Pain?  No/denies    Pain Score  0-No pain    Multiple Pain Sites  No       Capillary Blood Glucose: No results found for this or any previous visit (from the past 24 hour(s)).    Social History   Tobacco Use  Smoking Status Never Smoker  Smokeless Tobacco Former Systems developer  . Types: Chew    Goals Met:  Independence with exercise equipment Exercise tolerated well No report of cardiac concerns or symptoms Strength training completed today  Goals Unmet:  Not Applicable  Comments: Check out 1200.   [name] is Medical Director for [facility] Card[facility] and Pulmonary Rehab.

## 2017-08-02 ENCOUNTER — Encounter (HOSPITAL_COMMUNITY)
Admission: RE | Admit: 2017-08-02 | Discharge: 2017-08-02 | Disposition: A | Discharge status: Home / Self Care | Payer: PRIVATE HEALTH INSURANCE | Source: Ambulatory Visit | Attending: Cardiovascular Disease | Admitting: Cardiovascular Disease

## 2017-08-02 DIAGNOSIS — I213 ST elevation (STEMI) myocardial infarction of unspecified site: Secondary | ICD-10-CM | POA: Diagnosis not present

## 2017-08-02 DIAGNOSIS — I2121 ST elevation (STEMI) myocardial infarction involving left circumflex coronary artery: Secondary | ICD-10-CM

## 2017-08-02 DIAGNOSIS — Z955 Presence of coronary angioplasty implant and graft: Secondary | ICD-10-CM

## 2017-08-02 NOTE — Progress Notes (Signed)
Daily Session Note  Patient Details  Name: Daniel Mcgee MRN: [id number] Date of Birth: 10-07-1960 Referring Provider:     CARDIAC REHAB PHASE II ORIENTATION from 07/22/2017 in [place]  Referring Provider  [name]       Encounter Date: 08/02/2017  Check In: Session Check In - 08/02/17 1100      Check-In   Location  [facility]    Staff Present  Diane Angelina Pih, MS, EP, [facility], Exercise Physiologist;Dereck Agerton Wynetta Emery, RN, BSN    Supervising physician immediately available to respond to emergencies  See telemetry face sheet for immediately available MD    Medication changes reported      No    Fall or balance concerns reported     No    Warm-up and Cool-down  Performed as group-led instruction    Resistance Training Performed  Yes    VAD Patient?  No      Pain Assessment   Currently in Pain?  No/denies    Pain Score  0-No pain    Multiple Pain Sites  No       Capillary Blood Glucose: No results found for this or any previous visit (from the past 24 hour(s)).    Social History   Tobacco Use  Smoking Status Never Smoker  Smokeless Tobacco Former Systems developer  . Types: Chew    Goals Met:  Independence with exercise equipment Exercise tolerated well No report of cardiac concerns or symptoms Strength training completed today  Goals Unmet:  Not Applicable  Comments: Check out 1200.   [name] is Medical Director for [facility] Cardiac and Pulmonary Rehab.

## 2017-08-04 ENCOUNTER — Encounter (HOSPITAL_COMMUNITY)
Admission: RE | Admit: 2017-08-04 | Discharge: 2017-08-04 | Disposition: A | Discharge status: Home / Self Care | Payer: PRIVATE HEALTH INSURANCE | Source: Ambulatory Visit | Attending: Cardiovascular Disease | Admitting: Cardiovascular Disease

## 2017-08-04 DIAGNOSIS — I213 ST elevation (STEMI) myocardial infarction of unspecified site: Secondary | ICD-10-CM | POA: Diagnosis not present

## 2017-08-04 DIAGNOSIS — I2121 ST elevation (STEMI) myocardial infarction involving left circumflex coronary artery: Secondary | ICD-10-CM

## 2017-08-04 DIAGNOSIS — Z955 Presence of coronary angioplasty implant and graft: Secondary | ICD-10-CM

## 2017-08-04 NOTE — Progress Notes (Signed)
Daily Session Note  Patient Details  Name: Daniel Mcgee MRN: [id number] Date of Birth: [birth year] Referring Provider:     CARDIAC REHAB PHASE II ORIENTATION from 07/22/2017 in [place]  Referring Provider  [name]       Encounter Date: 08/04/2017  Check In: Session Check In - 08/04/17 1100      Check-In   Location  [facility]    Staff Present  Aundra Dubin, RN, BSN;Gregory Luther Parody, BS, EP, Exercise Physiologist    Supervising physician immediately available to respond to emergencies  See telemetry face sheet for immediately available MD    Medication changes reported      No    Fall or balance concerns reported     No    Warm-up and Cool-down  Performed as group-led instruction    Resistance Training Performed  Yes    VAD Patient?  No      Pain Assessment   Currently in Pain?  No/denies    Pain Score  0-No pain    Multiple Pain Sites  No       Capillary Blood Glucose: No results found for this or any previous visit (from the past 24 hour(s)).    Social History   Tobacco Use  Smoking Status Never Smoker  Smokeless Tobacco Former Systems developer  . Types: Chew    Goals Met:  Independence with exercise equipment Exercise tolerated well No report of cardiac concerns or symptoms Strength training completed today  Goals Unmet:  Not Applicable  Comments: Check out 1200.   [name] is Medical Director for [facility] Cardiac and Pulmonary Rehab.

## 2017-08-06 ENCOUNTER — Encounter (HOSPITAL_COMMUNITY)
Admission: RE | Admit: 2017-08-06 | Discharge: 2017-08-06 | Disposition: A | Discharge status: Home / Self Care | Payer: PRIVATE HEALTH INSURANCE | Source: Ambulatory Visit | Attending: Cardiovascular Disease | Admitting: Cardiovascular Disease

## 2017-08-06 DIAGNOSIS — Z955 Presence of coronary angioplasty implant and graft: Secondary | ICD-10-CM

## 2017-08-06 DIAGNOSIS — I2121 ST elevation (STEMI) myocardial infarction involving left circumflex coronary artery: Secondary | ICD-10-CM

## 2017-08-06 DIAGNOSIS — I213 ST elevation (STEMI) myocardial infarction of unspecified site: Secondary | ICD-10-CM | POA: Diagnosis not present

## 2017-08-06 NOTE — Progress Notes (Signed)
Daily Session Note  Patient Details  Name: Daniel Mcgee MRN: [id number] Date of Birth: 1960-10-01 Referring Provider:     CARDIAC REHAB PHASE II ORIENTATION from 07/22/2017 in [place]  Referring Provider  [name]       Encounter Date: 08/06/2017  Check In: Session Check In - 08/06/17 1111      Check-In   Location  [facility]    Staff Present  Aundra Dubin, RN, BSN;Maryiah Olvey Luther Parody, BS, EP, Exercise Physiologist    Supervising physician immediately available to respond to emergencies  See telemetry face sheet for immediately available MD    Fall or balance concerns reported     No    Tobacco Cessation  No Change    Warm-up and Cool-down  Performed as group-led instruction    Resistance Training Performed  Yes    VAD Patient?  No      Pain Assessment   Currently in Pain?  No/denies    Pain Score  0-No pain    Multiple Pain Sites  No       Capillary Blood Glucose: No results found for this or any previous visit (from the past 24 hour(s)).  Exercise Prescription Changes - 08/05/17 1500      Response to Exercise   Blood Pressure (Admit)  108/64    Blood Pressure (Exercise)  180/82    Blood Pressure (Exit)  100/62    Heart Rate (Admit)  64 bpm    Heart Rate (Exercise)  106 bpm    Heart Rate (Exit)  74 bpm    Rating of Perceived Exertion (Exercise)  11    Duration  Progress to 30 minutes of  aerobic without signs/symptoms of physical distress    Intensity  THRR New [phone]      Progression   Progression  Continue to progress workloads to maintain intensity without signs/symptoms of physical distress.      Resistance Training   Training Prescription  Yes    Weight  2    Reps  10-15      Treadmill   MPH  2.5    Grade  0    Minutes  20    METs  2.9      Elliptical   Level  1    Speed  38    Minutes  15    METs  2.1       Social History   Tobacco Use  Smoking Status Never Smoker  Smokeless Tobacco Former  Systems developer  . Types: Chew    Goals Met:  Independence with exercise equipment Exercise tolerated well No report of cardiac concerns or symptoms Strength training completed today  Goals Unmet:  Not Applicable  Comments: Check out 1200   [name] is Medical Director for [place] and Pulmonary Rehab.

## 2017-08-09 ENCOUNTER — Encounter (HOSPITAL_COMMUNITY)
Admission: RE | Admit: 2017-08-09 | Discharge: 2017-08-09 | Disposition: A | Discharge status: Home / Self Care | Payer: PRIVATE HEALTH INSURANCE | Source: Ambulatory Visit | Attending: Cardiovascular Disease | Admitting: Cardiovascular Disease

## 2017-08-09 DIAGNOSIS — I213 ST elevation (STEMI) myocardial infarction of unspecified site: Secondary | ICD-10-CM | POA: Insufficient documentation

## 2017-08-09 DIAGNOSIS — Z955 Presence of coronary angioplasty implant and graft: Secondary | ICD-10-CM | POA: Insufficient documentation

## 2017-08-09 DIAGNOSIS — I2121 ST elevation (STEMI) myocardial infarction involving left circumflex coronary artery: Secondary | ICD-10-CM

## 2017-08-09 DIAGNOSIS — Z8249 Family history of ischemic heart disease and other diseases of the circulatory system: Secondary | ICD-10-CM | POA: Diagnosis not present

## 2017-08-09 DIAGNOSIS — Z7902 Long term (current) use of antithrombotics/antiplatelets: Secondary | ICD-10-CM | POA: Diagnosis not present

## 2017-08-09 DIAGNOSIS — Z79899 Other long term (current) drug therapy: Secondary | ICD-10-CM | POA: Diagnosis not present

## 2017-08-09 DIAGNOSIS — Z7982 Long term (current) use of aspirin: Secondary | ICD-10-CM | POA: Diagnosis not present

## 2017-08-09 DIAGNOSIS — E785 Hyperlipidemia, unspecified: Secondary | ICD-10-CM | POA: Insufficient documentation

## 2017-08-09 DIAGNOSIS — Z87891 Personal history of nicotine dependence: Secondary | ICD-10-CM | POA: Insufficient documentation

## 2017-08-09 NOTE — Progress Notes (Signed)
Daily Session Note  Patient Details  Name: Daniel Mcgee MRN: [id number] Date of Birth: [birth year] Referring Provider:     CARDIAC REHAB PHASE II ORIENTATION from 07/22/2017 in [place]  Referring Provider  [name]       Encounter Date: 08/09/2017  Check In: Session Check In - 08/09/17 1059      Check-In   Location  [facility]    Staff Present  Aundra Dubin, RN, BSN;Guiselle Mian Luther Parody, BS, EP, Exercise Physiologist;Diane Coad, MS, EP, [facility], Exercise Physiologist    Supervising physician immediately available to respond to emergencies  See telemetry face sheet for immediately available MD    Medication changes reported      No    Fall or balance concerns reported     No    Tobacco Cessation  No Change    Warm-up and Cool-down  Performed as group-led instruction    Resistance Training Performed  Yes    VAD Patient?  No      Pain Assessment   Currently in Pain?  No/denies    Pain Score  0-No pain    Multiple Pain Sites  No       Capillary Blood Glucose: No results found for this or any previous visit (from the past 24 hour(s)).    Social History   Tobacco Use  Smoking Status Never Smoker  Smokeless Tobacco Former Systems developer  . Types: Chew    Goals Met:  Independence with exercise equipment Exercise tolerated well No report of cardiac concerns or symptoms Strength training completed today  Goals Unmet:  Not Applicable  Comments: Check out 1200   [name] is Medical Director for [place] and Pulmonary Rehab.

## 2017-08-11 ENCOUNTER — Encounter (HOSPITAL_COMMUNITY)
Admission: RE | Admit: 2017-08-11 | Discharge: 2017-08-11 | Disposition: A | Discharge status: Home / Self Care | Payer: PRIVATE HEALTH INSURANCE | Source: Ambulatory Visit | Attending: Cardiovascular Disease | Admitting: Cardiovascular Disease

## 2017-08-11 DIAGNOSIS — I213 ST elevation (STEMI) myocardial infarction of unspecified site: Secondary | ICD-10-CM | POA: Diagnosis not present

## 2017-08-11 DIAGNOSIS — I2121 ST elevation (STEMI) myocardial infarction involving left circumflex coronary artery: Secondary | ICD-10-CM

## 2017-08-11 DIAGNOSIS — Z955 Presence of coronary angioplasty implant and graft: Secondary | ICD-10-CM

## 2017-08-11 NOTE — Progress Notes (Signed)
Cardiac Individual Treatment Plan  Patient Details  Name: Daniel Mcgee MRN: [id number] Date of Birth: Feb 21, 1961 Referring Provider:     CARDIAC REHAB PHASE II ORIENTATION from 07/22/2017 in [place]  Referring Provider  [name]       Initial Encounter Date:    CARDIAC REHAB PHASE II ORIENTATION from 07/22/2017 in [place]  Date  07/22/17  Referring Provider  [name]       Visit Diagnosis: ST elevation myocardial infarction involving left circumflex coronary artery [facility])  Status post coronary artery stent placement  Patient's Home Medications on Admission:  Current Outpatient Medications:  .  aspirin EC 81 MG tablet, Take 81 mg by mouth daily., Disp: , Rfl:  .  atorvastatin (LIPITOR) 80 MG tablet, Take 1 tablet (80 mg total) by mouth daily at 6 PM., Disp: 90 tablet, Rfl: 1 .  metoprolol tartrate (LOPRESSOR) 25 MG tablet, Take 0.5 tablets (12.5 mg total) by mouth 2 (two) times daily., Disp: 90 tablet, Rfl: 1 .  nitroGLYCERIN (NITROSTAT) 0.4 MG SL tablet, Place 1 tablet (0.4 mg total) under the tongue every 5 (five) minutes x 3 doses as needed for chest pain., Disp: 25 tablet, Rfl: 2 .  ticagrelor (BRILINTA) 90 MG TABS tablet, Take 1 tablet (90 mg total) by mouth 2 (two) times daily., Disp: 180 tablet, Rfl: 2  Past Medical History: Past Medical History:  Diagnosis Date  . Family history of premature CAD   . HLD (hyperlipidemia)   . STEMI (ST elevation myocardial infarction) ([place])    2/19 PCI/DES x1 to Lcx, mild disease in RCA, LAD, normal EF    Tobacco Use: Social History   Tobacco Use  Smoking Status Never Smoker  Smokeless Tobacco Former Systems developer  . Types: Chew    Labs: Recent Review Flowsheet Data    Labs for ITP Cardiac and Pulmonary Rehab Latest Ref Rng & Units 06/22/2017 06/23/2017   Cholestrol 0 - 200 mg/dL 150 137   LDLCALC 0 - 99 mg/dL 72 69   HDL >40 mg/dL 54 47   Trlycerides <150 mg/dL 118 104    Hemoglobin A1c 4.8 - 5.6 % 6.7(H) -   TCO2 22 - 32 mmol/L 21(L) -      Capillary Blood Glucose: Lab Results  Component Value Date   GLUCAP 123 (H) 06/23/2017     Exercise Target Goals:    Exercise Program Goal: Individual exercise prescription set using results from initial 6 min walk test and THRR while considering  patient's activity barriers and safety.   Exercise Prescription Goal: Starting with aerobic activity 30 plus minutes a day, 3 days per week for initial exercise prescription. Provide home exercise prescription and guidelines that participant acknowledges understanding prior to discharge.  Activity Barriers & Risk Stratification: Activity Barriers & Cardiac Risk Stratification - 07/22/17 1357      Activity Barriers & Cardiac Risk Stratification   Activity Barriers  None    Cardiac Risk Stratification  High       6 Minute Walk: 6 Minute Walk    Row Name 07/22/17 1357         6 Minute Walk   Phase  Initial     Distance  1450 feet     Distance % Change  0 %     Distance Feet Change  0 ft     Walk Time  6 minutes     # of Rest Breaks  0  MPH  2.74     METS  3.1     RPE  9     Perceived Dyspnea   8     VO2 Peak  15.8     Symptoms  No     Resting HR  78 bpm     Resting BP  122/82     Resting Oxygen Saturation   97 %     Exercise Oxygen Saturation  during 6 min walk  90 %     Max Ex. HR  120 bpm     Max Ex. BP  160/86     2 Minute Post BP  124/76        Oxygen Initial Assessment:   Oxygen Re-Evaluation:   Oxygen Discharge (Final Oxygen Re-Evaluation):   Initial Exercise Prescription: Initial Exercise Prescription - 07/22/17 1500      Elliptical   Level  --    Speed  --    Minutes  --    METs  --      Prescription Details   Frequency (times per week)  --    Duration  --      Intensity   THRR 40-80% of Max Heartrate  --    Ratings of Perceived Exertion  --    Perceived Dyspnea  --      Progression   Progression  --       Resistance Training   Training Prescription  --    Weight  --    Reps  --       Perform Capillary Blood Glucose checks as needed.  Exercise Prescription Changes:  Exercise Prescription Changes    Row Name 07/27/17 1306 08/05/17 1500           Response to Exercise   Blood Pressure (Admit)  122/68  108/64      Blood Pressure (Exercise)  150/80  180/82      Blood Pressure (Exit)  110/54  100/62      Heart Rate (Admit)  59 bpm  64 bpm      Heart Rate (Exercise)  94 bpm  106 bpm      Heart Rate (Exit)  70 bpm  74 bpm      Rating of Perceived Exertion (Exercise)  12  11      Duration  Progress to 30 minutes of  aerobic without signs/symptoms of physical distress  Progress to 30 minutes of  aerobic without signs/symptoms of physical distress      Intensity  THRR New [phone]  THRR New [phone]        Progression   Progression  Continue to progress workloads to maintain intensity without signs/symptoms of physical distress.  Continue to progress workloads to maintain intensity without signs/symptoms of physical distress.        Resistance Training   Training Prescription  Yes  Yes      Weight  1  2      Reps  10-15  10-15        Treadmill   MPH  2.2  2.5      Grade  0  0      Minutes  15  20      METs  2.6  2.9        Elliptical   Level  1  1      Speed  2.5  38      Minutes  15  15  METs  2.2  2.1         Exercise Comments:  Exercise Comments    Row Name 07/27/17 1308 08/05/17 1518         Exercise Comments  Patient has just started CR and will be progressed in time.   Patient continues to do well in CR and has been doing very well on the stand up elliptical machine. Patient has increased his speed on the treadmill as well to 2.5. Patients blood pressure is still somewhat high during exercise and will continued to be monitored throughout the remainder of the program. Patient will be progressedin time more since still only on his 7th session.           Exercise Goals and Review:  Exercise Goals    Row Name 07/22/17 1359             Exercise Goals   Increase Physical Activity  Yes       Intervention  Provide advice, education, support and counseling about physical activity/exercise needs.;Develop an individualized exercise prescription for aerobic and resistive training based on initial evaluation findings, risk stratification, comorbidities and participant's personal goals.       Expected Outcomes  Short Term: Attend rehab on a regular basis to increase amount of physical activity.       Increase Strength and Stamina  Yes       Intervention  Develop an individualized exercise prescription for aerobic and resistive training based on initial evaluation findings, risk stratification, comorbidities and participant's personal goals.;Provide advice, education, support and counseling about physical activity/exercise needs.       Expected Outcomes  Short Term: Increase workloads from initial exercise prescription for resistance, speed, and METs.       Able to understand and use rate of perceived exertion (RPE) scale  Yes       Intervention  Provide education and explanation on how to use RPE scale       Expected Outcomes  Short Term: Able to use RPE daily in rehab to express subjective intensity level;Long Term:  Able to use RPE to guide intensity level when exercising independently       Able to understand and use Dyspnea scale  Yes       Intervention  Provide education and explanation on how to use Dyspnea scale       Expected Outcomes  Short Term: Able to use Dyspnea scale daily in rehab to express subjective sense of shortness of breath during exertion;Long Term: Able to use Dyspnea scale to guide intensity level when exercising independently       Knowledge and understanding of Target Heart Rate Range (THRR)  Yes       Intervention  Provide education and explanation of THRR including how the numbers were predicted and where they are  located for reference       Expected Outcomes  Long Term: Able to use THRR to govern intensity when exercising independently;Short Term: Able to state/look up THRR;Short Term: Able to use daily as guideline for intensity in rehab       Able to check pulse independently  Yes       Intervention  Provide education and demonstration on how to check pulse in carotid and radial arteries.;Review the importance of being able to check your own pulse for safety during independent exercise       Expected Outcomes  Short Term: Able to explain why pulse checking is important during independent exercise;Long Term:  Able to check pulse independently and accurately       Understanding of Exercise Prescription  Yes       Intervention  Provide education, explanation, and written materials on patient's individual exercise prescription       Expected Outcomes  Short Term: Able to explain program exercise prescription;Long Term: Able to explain home exercise prescription to exercise independently          Exercise Goals Re-Evaluation : Exercise Goals Re-Evaluation    Row Name 08/05/17 1517             Exercise Goal Re-Evaluation   Exercise Goals Review  Increase Physical Activity;Able to understand and use rate of perceived exertion (RPE) scale;Knowledge and understanding of Target Heart Rate Range (THRR);Understanding of Exercise Prescription;Able to understand and use Dyspnea scale       Comments  Patient continues to do well in CR and has been doing very well on the stand up elliptical machine. Patient has increased his speed on the treadmill as well to 2.5. Patients blood pressure is still somewhat high during exercise and will continued to be monitored throughout the remainder of the program. Patient will be progressedin time more since still only on his 7th session.        Expected Outcomes  Patient wishes to get back to work and to be able to work without any health problems.            Discharge  Exercise Prescription (Final Exercise Prescription Changes): Exercise Prescription Changes - 08/05/17 1500      Response to Exercise   Blood Pressure (Admit)  108/64    Blood Pressure (Exercise)  180/82    Blood Pressure (Exit)  100/62    Heart Rate (Admit)  64 bpm    Heart Rate (Exercise)  106 bpm    Heart Rate (Exit)  74 bpm    Rating of Perceived Exertion (Exercise)  11    Duration  Progress to 30 minutes of  aerobic without signs/symptoms of physical distress    Intensity  THRR New [phone]      Progression   Progression  Continue to progress workloads to maintain intensity without signs/symptoms of physical distress.      Resistance Training   Training Prescription  Yes    Weight  2    Reps  10-15      Treadmill   MPH  2.5    Grade  0    Minutes  20    METs  2.9      Elliptical   Level  1    Speed  38    Minutes  15    METs  2.1       Nutrition:  Target Goals: Understanding of nutrition guidelines, daily intake of sodium 1500mg , cholesterol 200mg , calories 30% from fat and 7% or less from saturated fats, daily to have 5 or more servings of fruits and vegetables.  Biometrics: Pre Biometrics - 07/22/17 1400      Pre Biometrics   Height  6\' 3"  (1.905 m)    Weight  212 lb 6.4 oz (96.3 kg)    Waist Circumference  37.5 inches    Hip Circumference  40 inches    Waist to Hip Ratio  0.94 %    BMI (Calculated)  26.55    Triceps Skinfold  6 mm    % Body Fat  21.3 %    Grip Strength  67.4 kg    Flexibility  18.83 in    Single Leg Stand  23 seconds        Nutrition Therapy Plan and Nutrition Goals: Nutrition Therapy & Goals - 08/11/17 1313      Nutrition Therapy   RD appointment deferred  Yes      Personal Nutrition Goals   Personal Goal #2  Patient continues to eat heart healthy. No salt, non fat products, no fired food, and lean meats. He plans to attend the RD appointment tomorrow.     Additional Goals?  No      Intervention Plan   Expected  Outcomes  Short Term Goal: Understand basic principles of dietary content, such as calories, fat, sodium, cholesterol and nutrients.;Long Term Goal: Adherence to prescribed nutrition plan.       Nutrition Assessments: Nutrition Assessments - 07/22/17 1528      MEDFICTS Scores   Pre Score  6       Nutrition Goals Re-Evaluation:   Nutrition Goals Discharge (Final Nutrition Goals Re-Evaluation):   Psychosocial: Target Goals: Acknowledge presence or absence of significant depression and/or stress, maximize coping skills, provide positive support system. Participant is able to verbalize types and ability to use techniques and skills needed for reducing stress and depression.  Initial Review & Psychosocial Screening: Initial Psych Review & Screening - 07/22/17 1531      Initial Review   Current issues with  None Identified      Family Dynamics   Good Support System?  Yes      Barriers   Psychosocial barriers to participate in program  There are no identifiable barriers or psychosocial needs.      Screening Interventions   Interventions  Encouraged to exercise    Expected Outcomes  Short Term goal: Identification and review with participant of any Quality of Life or Depression concerns found by scoring the questionnaire.;Long Term goal: The participant improves quality of Life and PHQ9 Scores as seen by post scores and/or verbalization of changes       Quality of Life Scores: Quality of Life - 07/22/17 1401      Quality of Life Scores   Health/Function Pre  28.4 %    Socioeconomic Pre  28.29 %    Psych/Spiritual Pre  30 %    Family Pre  27.6 %    GLOBAL Pre  28.59 %      Scores of 19 and below usually indicate a poorer quality of life in these areas.  A difference of  2-3 points is a clinically meaningful difference.  A difference of 2-3 points in the total score of the Quality of Life Index has been associated with significant improvement in overall quality of life,  self-image, physical symptoms, and general health in studies assessing change in quality of life.  PHQ-9: Recent Review Flowsheet Data    Depression screen [facility] 2/9 07/22/2017   Decreased Interest 0   Down, Depressed, Hopeless 0   PHQ - 2 Score 0   Altered sleeping 0   Tired, decreased energy 0   Change in appetite 0   Feeling bad or failure about yourself  0   Trouble concentrating 0   Moving slowly or fidgety/restless 0   Suicidal thoughts 0   PHQ-9 Score 0     Interpretation of Total Score  Total Score Depression Severity:  1-4 = Minimal depression, 5-9 = Mild depression, 10-14 = Moderate depression, 15-19 = Moderately severe depression, 20-27 = Severe depression   Psychosocial Evaluation and Intervention:  Psychosocial Evaluation - 07/22/17 1531      Psychosocial Evaluation & Interventions   Interventions  Encouraged to exercise with the program and follow exercise prescription    Continue Psychosocial Services   No Follow up required       Psychosocial Re-Evaluation: Psychosocial Re-Evaluation    [place] Name 08/11/17 1316             Psychosocial Re-Evaluation   Current issues with  None Identified       Comments  Patient's initial QOL score was 28.59 and his PHQ-9 score was 0 with no psychosocial issues identified.        Expected Outcomes  Patient will have no psychosocial issues identified at discharge.        Interventions  Stress management education;Encouraged to attend Cardiac Rehabilitation for the exercise;Relaxation education       Continue Psychosocial Services   No Follow up required          Psychosocial Discharge (Final Psychosocial Re-Evaluation): Psychosocial Re-Evaluation - 08/11/17 1316      Psychosocial Re-Evaluation   Current issues with  None Identified    Comments  Patient's initial QOL score was 28.59 and his PHQ-9 score was 0 with no psychosocial issues identified.     Expected Outcomes  Patient will have no psychosocial issues identified  at discharge.     Interventions  Stress management education;Encouraged to attend Cardiac Rehabilitation for the exercise;Relaxation education    Continue Psychosocial Services   No Follow up required       Vocational Rehabilitation: Provide vocational rehab assistance to qualifying candidates.   Vocational Rehab Evaluation & Intervention: Vocational Rehab - 07/22/17 1516      Initial Vocational Rehab Evaluation & Intervention   Assessment shows need for Vocational Rehabilitation  No       Education: Education Goals: Education classes will be provided on a weekly basis, covering required topics. Participant will state understanding/return demonstration of topics presented.  Learning Barriers/Preferences: Learning Barriers/Preferences - 07/22/17 1515      Learning Barriers/Preferences   Learning Barriers  Hearing    Learning Preferences  Skilled Demonstration;Individual Instruction;Group Instruction       Education Topics: Hypertension, Hypertension Reduction -Define heart disease and high blood pressure. Discus how high blood pressure affects the body and ways to reduce high blood pressure.   Exercise and Your Heart -Discuss why it is important to exercise, the FITT principles of exercise, normal and abnormal responses to exercise, and how to exercise safely.   Angina -Discuss definition of angina, causes of angina, treatment of angina, and how to decrease risk of having angina.   Cardiac Medications -Review what the following cardiac medications are used for, how they affect the body, and side effects that may occur when taking the medications.  Medications include Aspirin, Beta blockers, calcium channel blockers, ACE Inhibitors, angiotensin receptor blockers, diuretics, digoxin, and antihyperlipidemics.   Congestive Heart Failure -Discuss the definition of CHF, how to live with CHF, the signs and symptoms of CHF, and how keep track of weight and sodium  intake.   Heart Disease and Intimacy -Discus the effect sexual activity has on the heart, how changes occur during intimacy as we age, and safety during sexual activity.   Smoking Cessation / COPD -Discuss different methods to quit smoking, the health benefits of quitting smoking, and the definition of COPD.   CARDIAC REHAB PHASE II EXERCISE from 08/11/2017 in [place]  Date  07/29/17  Educator  DC  Instruction Review Code  2- Demonstrated Understanding      Nutrition I: Fats -Discuss the types of cholesterol, what cholesterol does to the heart, and how cholesterol levels can be controlled.   CARDIAC REHAB PHASE II EXERCISE from 08/11/2017 in [place]  Date  08/04/17  Educator  DC  Instruction Review Code  2- Demonstrated Understanding      Nutrition II: Labels -Discuss the different components of food labels and how to read food label   CARDIAC REHAB PHASE II EXERCISE from 08/11/2017 in [place]  Date  08/11/17  Educator  [place]  Instruction Review Code  2- Demonstrated Understanding      Heart Parts/Heart Disease and PAD -Discuss the anatomy of the heart, the pathway of blood circulation through the heart, and these are affected by heart disease.   Stress I: Signs and Symptoms -Discuss the causes of stress, how stress may lead to anxiety and depression, and ways to limit stress.   Stress II: Relaxation -Discuss different types of relaxation techniques to limit stress.   Warning Signs of Stroke / TIA -Discuss definition of a stroke, what the signs and symptoms are of a stroke, and how to identify when someone is having stroke.   Knowledge Questionnaire Score: Knowledge Questionnaire Score - 07/22/17 1515      Knowledge Questionnaire Score   Pre Score  22/24       Core Components/Risk Factors/Patient Goals at Admission: Personal Goals and Risk Factors at Admission - 07/22/17 1528      Core  Components/Risk Factors/Patient Goals on Admission    Weight Management  Weight Maintenance    Personal Goal Other  Yes    Personal Goal  To get back to work.     Intervention  Attend CR program 3 x week and supplement with home exercise 2 x week.     Expected Outcomes  Reach personal goals.        Core Components/Risk Factors/Patient Goals Review:  Goals and Risk Factor Review    Row Name 08/11/17 1314             Core Components/Risk Factors/Patient Goals Review   Personal Goals Review  Weight Management/Obesity Get back to work; work without any problems.        Review  Patient has completed 10 sessions losing 9.4 lbs since he started. He is doing well in the program with progression. He says he has cut out all sugar from his diet. He says he feels stronger and hopes to get stronger as he continues the program. Will continue to monitor.        Expected Outcomes  Patient will continue to attend sessions and complete the program meeting his personal needs.           Core Components/Risk Factors/Patient Goals at Discharge (Final Review):  Goals and Risk Factor Review - 08/11/17 1314      Core Components/Risk Factors/Patient Goals Review   Personal Goals Review  Weight Management/Obesity Get back to work; work without any problems.     Review  Patient has completed 10 sessions losing 9.4 lbs since he started. He is doing well in the program with progression. He says he has cut out all sugar from his diet. He says he feels stronger and hopes to get stronger as he continues the program. Will continue to monitor.     Expected Outcomes  Patient will continue to attend sessions and complete the program  meeting his personal needs.        ITP Comments:   Comments: ITP 30 Day REVIEW Patient doing well in the program. Will continue to monitor for progress.

## 2017-08-11 NOTE — Progress Notes (Signed)
Daily Session Note  Patient Details  Name: Daniel Mcgee MRN: [id number] Date of Birth: 02-24-1961 Referring Provider:     CARDIAC REHAB PHASE II ORIENTATION from 07/22/2017 in [place]  Referring Provider  Daniel Mcgee       Encounter Date: 08/11/2017  Check In: Session Check In - 08/11/17 1100      Check-In   Location  [facility]    Staff Present  Daniel Dubin, RN, BSN;Daniel Mcgee, BS, EP, Exercise Physiologist;Daniel Coad, MS, EP, Daniel Mcgee, Exercise Physiologist    Supervising physician immediately available to respond to emergencies  See telemetry face sheet for immediately available MD    Medication changes reported      No    Fall or balance concerns reported     No    Warm-up and Cool-down  Performed as group-led instruction    Resistance Training Performed  Yes    VAD Patient?  No      Pain Assessment   Currently in Pain?  No/denies    Pain Score  0-No pain    Multiple Pain Sites  No       Capillary Blood Glucose: No results found for this or any previous visit (from the past 24 hour(s)).    Social History   Tobacco Use  Smoking Status Never Smoker  Smokeless Tobacco Former Systems developer  . Types: Chew    Goals Met:  Independence with exercise equipment Exercise tolerated well No report of cardiac concerns or symptoms Strength training completed today  Goals Unmet:  Not Applicable  Comments: Check out 1200.   Daniel Mcgee is Medical Director for [facility] Cardiac and Pulmonary Rehab.

## 2017-08-13 ENCOUNTER — Encounter (HOSPITAL_COMMUNITY)
Admission: RE | Admit: 2017-08-13 | Discharge: 2017-08-13 | Disposition: A | Discharge status: Home / Self Care | Payer: PRIVATE HEALTH INSURANCE | Source: Ambulatory Visit | Attending: Cardiovascular Disease | Admitting: Cardiovascular Disease

## 2017-08-13 DIAGNOSIS — I2121 ST elevation (STEMI) myocardial infarction involving left circumflex coronary artery: Secondary | ICD-10-CM

## 2017-08-13 DIAGNOSIS — Z955 Presence of coronary angioplasty implant and graft: Secondary | ICD-10-CM

## 2017-08-13 DIAGNOSIS — I213 ST elevation (STEMI) myocardial infarction of unspecified site: Secondary | ICD-10-CM | POA: Diagnosis not present

## 2017-08-13 NOTE — Progress Notes (Signed)
Daily Session Note  Patient Details  Name: Daniel Mcgee MRN: [id number] Date of Birth: 07/24/1960 Referring Provider:     CARDIAC REHAB PHASE II ORIENTATION from 07/22/2017 in [place] CARDIAC REHABILITATION  Referring Provider  Dr. Cooper       Encounter Date: 08/13/2017  Check In: Session Check In - 08/13/17 1100      Check-In   Location  [facility]    Staff Present  Debra Johnson, RN, BSN;Gregory Cowan, BS, EP, Exercise Physiologist;Diane Coad, MS, EP, [facility], Exercise Physiologist    Supervising physician immediately available to respond to emergencies  See telemetry face sheet for immediately available MD    Medication changes reported      No    Fall or balance concerns reported     No    Warm-up and Cool-down  Performed as group-led instruction    Resistance Training Performed  Yes    VAD Patient?  No      Pain Assessment   Currently in Pain?  No/denies    Pain Score  0-No pain    Multiple Pain Sites  No       Capillary Blood Glucose: No results found for this or any previous visit (from the past 24 hour(s)).    Social History   Tobacco Use  Smoking Status Never Smoker  Smokeless Tobacco Former User  . Types: Chew    Goals Met:  Independence with exercise equipment Exercise tolerated well No report of cardiac concerns or symptoms Strength training completed today  Goals Unmet:  Not Applicable  Comments: Check out 1200.   Dr. Suresh Koneswaran is Medical Director for  Cardiac and Pulmonary Rehab. 

## 2017-08-16 ENCOUNTER — Encounter (HOSPITAL_COMMUNITY)
Admission: RE | Admit: 2017-08-16 | Discharge: 2017-08-16 | Disposition: A | Discharge status: Home / Self Care | Payer: PRIVATE HEALTH INSURANCE | Source: Ambulatory Visit | Attending: Cardiovascular Disease | Admitting: Cardiovascular Disease

## 2017-08-16 DIAGNOSIS — I213 ST elevation (STEMI) myocardial infarction of unspecified site: Secondary | ICD-10-CM | POA: Diagnosis not present

## 2017-08-16 DIAGNOSIS — I2121 ST elevation (STEMI) myocardial infarction involving left circumflex coronary artery: Secondary | ICD-10-CM

## 2017-08-16 DIAGNOSIS — Z955 Presence of coronary angioplasty implant and graft: Secondary | ICD-10-CM

## 2017-08-16 NOTE — Progress Notes (Signed)
Daily Session Note  Patient Details  Name: Daniel Mcgee MRN: [id number] Date of Birth: 01/14/1961 Referring Provider:     CARDIAC REHAB PHASE II ORIENTATION from 07/22/2017 in [place] [facility]  Referring Provider  Dr. Cooper       Encounter Date: 08/16/2017  Check In: Session Check In - 08/16/17 1116      Check-In   Location  [facility]    Staff Present  Alie Moudy, BS, EP, Exercise Physiologist;Diane Coad, MS, EP, [facility], Exercise Physiologist    Supervising physician immediately available to respond to emergencies  See telemetry face sheet for immediately available MD    Medication changes reported      No    Fall or balance concerns reported     No    Tobacco Cessation  No Change    Warm-up and Cool-down  Performed as group-led instruction    Resistance Training Performed  Yes    VAD Patient?  No      Pain Assessment   Currently in Pain?  No/denies    Pain Score  0-No pain    Multiple Pain Sites  No       Capillary Blood Glucose: No results found for this or any previous visit (from the past 24 hour(s)).    Social History   Tobacco Use  Smoking Status Never Smoker  Smokeless Tobacco Former User  . Types: Chew    Goals Met:  Independence with exercise equipment Exercise tolerated well No report of cardiac concerns or symptoms Strength training completed today  Goals Unmet:  Not Applicable  Comments: Check out 1200   Dr. Suresh Koneswaran is Medical Director for [place] [facility] and [facility]. 

## 2017-08-18 ENCOUNTER — Encounter (HOSPITAL_COMMUNITY)
Admission: RE | Admit: 2017-08-18 | Discharge: 2017-08-18 | Disposition: A | Discharge status: Home / Self Care | Payer: PRIVATE HEALTH INSURANCE | Source: Ambulatory Visit | Attending: Cardiovascular Disease | Admitting: Cardiovascular Disease

## 2017-08-18 DIAGNOSIS — Z955 Presence of coronary angioplasty implant and graft: Secondary | ICD-10-CM

## 2017-08-18 DIAGNOSIS — I2121 ST elevation (STEMI) myocardial infarction involving left circumflex coronary artery: Secondary | ICD-10-CM

## 2017-08-18 DIAGNOSIS — I213 ST elevation (STEMI) myocardial infarction of unspecified site: Secondary | ICD-10-CM | POA: Diagnosis not present

## 2017-08-18 NOTE — Progress Notes (Signed)
Daily Session Note  Patient Details  Name: Daniel Mcgee MRN: [id number] Date of Birth: Aug 07, 1960 Referring Provider:     CARDIAC REHAB PHASE II ORIENTATION from 07/22/2017 in [name]  Referring Provider  Daniel Mcgee       Encounter Date: 08/18/2017  Check In: Session Check In - 08/18/17 1100      Check-In   Location  [facility]    Staff Present  Daniel Mcgee, BS, EP, Exercise Physiologist;Daniel Coad, MS, EP, CH[facility], Exercise Physiologist;Daniel Mahler Wynetta Emery, RN, BSN    Supervising physician immediately available to respond to emergencies  See telemetry face sheet for immediately available MD    Medication changes reported      No    Fall or balance concerns reported     No    Warm-up and Cool-down  Performed as group-led instruction    Resistance Training Performed  Yes    VAD Patient?  No      Pain Assessment   Currently in Pain?  No/denies    Pain Score  0-No pain    Multiple Pain Sites  No       Capillary Blood Glucose: No results found for this or any previous visit (from the past 24 hour(s)).    Social History   Tobacco Use  Smoking Status Never Smoker  Smokeless Tobacco Former Systems developer  . Types: Chew    Goals Met:  Independence with exercise equipment Exercise tolerated well No report of cardiac concerns or symptoms Strength training completed today  Goals Unmet:  Not Applicable  Comments: Check out 1200.   Daniel Mcgee is Medical Director for [facility] Cardiac and Pulmonary Rehab.

## 2017-08-20 ENCOUNTER — Encounter (HOSPITAL_COMMUNITY)
Admission: RE | Admit: 2017-08-20 | Discharge: 2017-08-20 | Disposition: A | Discharge status: Home / Self Care | Payer: PRIVATE HEALTH INSURANCE | Source: Ambulatory Visit | Attending: Cardiovascular Disease | Admitting: Cardiovascular Disease

## 2017-08-20 DIAGNOSIS — I213 ST elevation (STEMI) myocardial infarction of unspecified site: Secondary | ICD-10-CM | POA: Diagnosis not present

## 2017-08-20 DIAGNOSIS — I2121 ST elevation (STEMI) myocardial infarction involving left circumflex coronary artery: Secondary | ICD-10-CM

## 2017-08-20 DIAGNOSIS — Z955 Presence of coronary angioplasty implant and graft: Secondary | ICD-10-CM

## 2017-08-20 NOTE — Progress Notes (Signed)
Daily Session Note  Patient Details  Name: Daniel Mcgee MRN: [id number] Date of Birth: 12-02-1960 Referring Provider:     CARDIAC REHAB PHASE II ORIENTATION from 07/22/2017 in [place]  Referring Provider  [name]       Encounter Date: 08/20/2017  Check In: Session Check In - 08/20/17 1100      Check-In   Location  [facility]    Staff Present  Aundra Dubin, RN, BSN;Gregory Luther Parody, BS, EP, Exercise Physiologist    Supervising physician immediately available to respond to emergencies  See telemetry face sheet for immediately available MD    Medication changes reported      No    Fall or balance concerns reported     No    Warm-up and Cool-down  Performed as group-led instruction    Resistance Training Performed  Yes    VAD Patient?  No      Pain Assessment   Currently in Pain?  No/denies    Pain Score  0-No pain    Multiple Pain Sites  No       Capillary Blood Glucose: No results found for this or any previous visit (from the past 24 hour(s)).    Social History   Tobacco Use  Smoking Status Never Smoker  Smokeless Tobacco Former Systems developer  . Types: Chew    Goals Met:  Independence with exercise equipment Exercise tolerated well No report of cardiac concerns or symptoms Strength training completed today  Goals Unmet:  Not Applicable  Comments: Check out 1200.   [name] is Medical Director for [facility] Cardiac and Pulmonary Rehab.

## 2017-08-23 ENCOUNTER — Encounter (HOSPITAL_COMMUNITY)
Admission: RE | Admit: 2017-08-23 | Discharge: 2017-08-23 | Disposition: A | Discharge status: Home / Self Care | Payer: PRIVATE HEALTH INSURANCE | Source: Ambulatory Visit | Attending: Cardiovascular Disease | Admitting: Cardiovascular Disease

## 2017-08-23 DIAGNOSIS — Z955 Presence of coronary angioplasty implant and graft: Secondary | ICD-10-CM

## 2017-08-23 DIAGNOSIS — I213 ST elevation (STEMI) myocardial infarction of unspecified site: Secondary | ICD-10-CM | POA: Diagnosis not present

## 2017-08-23 DIAGNOSIS — I2121 ST elevation (STEMI) myocardial infarction involving left circumflex coronary artery: Secondary | ICD-10-CM

## 2017-08-23 NOTE — Progress Notes (Signed)
Daily Session Note  Patient Details  Name: Daniel Mcgee MRN: [id number] Date of Birth: 1960-11-20 Referring Provider:     CARDIAC REHAB PHASE II ORIENTATION from 07/22/2017 in [place]  Referring Provider  [name]       Encounter Date: 08/23/2017  Check In: Session Check In - 08/23/17 1100      Check-In   Location  [facility]    Staff Present  Aundra Dubin, RN, BSN;Gregory Luther Parody, BS, EP, Exercise Physiologist    Supervising physician immediately available to respond to emergencies  See telemetry face sheet for immediately available MD    Medication changes reported      No    Fall or balance concerns reported     No    Warm-up and Cool-down  Performed as group-led instruction    Resistance Training Performed  Yes    VAD Patient?  No      Pain Assessment   Currently in Pain?  No/denies    Pain Score  0-No pain    Multiple Pain Sites  No       Capillary Blood Glucose: No results found for this or any previous visit (from the past 24 hour(s)).    Social History   Tobacco Use  Smoking Status Never Smoker  Smokeless Tobacco Former Systems developer  . Types: Chew    Goals Met:  Independence with exercise equipment Exercise tolerated well No report of cardiac concerns or symptoms Strength training completed today  Goals Unmet:  Not Applicable  Comments: Check out 1200.   [name] is Medical Director for [facility] Cardiac and Pulmonary Rehab.

## 2017-08-25 ENCOUNTER — Encounter (HOSPITAL_COMMUNITY)
Admission: RE | Admit: 2017-08-25 | Discharge: 2017-08-25 | Disposition: A | Discharge status: Home / Self Care | Payer: PRIVATE HEALTH INSURANCE | Source: Ambulatory Visit | Attending: Cardiovascular Disease | Admitting: Cardiovascular Disease

## 2017-08-25 DIAGNOSIS — Z955 Presence of coronary angioplasty implant and graft: Secondary | ICD-10-CM

## 2017-08-25 DIAGNOSIS — I2121 ST elevation (STEMI) myocardial infarction involving left circumflex coronary artery: Secondary | ICD-10-CM

## 2017-08-25 DIAGNOSIS — I213 ST elevation (STEMI) myocardial infarction of unspecified site: Secondary | ICD-10-CM | POA: Diagnosis not present

## 2017-08-25 NOTE — Progress Notes (Signed)
Daily Session Note  Patient Details  Name: Daniel Mcgee MRN: [id number] Date of Birth: May 31, 1960 Referring Provider:     CARDIAC REHAB PHASE II ORIENTATION from 07/22/2017 in [place]  Referring Provider  [name]       Encounter Date: 08/25/2017  Check In: Session Check In - 08/25/17 1107      Check-In   Location  [facility]    Staff Present  Aundra Dubin, RN, BSN;Verbena Boeding Luther Parody, BS, EP, Exercise Physiologist    Supervising physician immediately available to respond to emergencies  See telemetry face sheet for immediately available MD    Medication changes reported      No    Fall or balance concerns reported     No    Tobacco Cessation  No Change    Warm-up and Cool-down  Performed as group-led instruction    Resistance Training Performed  Yes    VAD Patient?  No      Pain Assessment   Currently in Pain?  No/denies    Pain Score  0-No pain    Multiple Pain Sites  No       Capillary Blood Glucose: No results found for this or any previous visit (from the past 24 hour(s)).    Social History   Tobacco Use  Smoking Status Never Smoker  Smokeless Tobacco Former Systems developer  . Types: Chew    Goals Met:  Independence with exercise equipment Exercise tolerated well No report of cardiac concerns or symptoms Strength training completed today  Goals Unmet:  Not Applicable  Comments: Check out 1200   [name] is Medical Director for [place] and Pulmonary Rehab.

## 2017-08-27 ENCOUNTER — Encounter (HOSPITAL_COMMUNITY)
Admission: RE | Admit: 2017-08-27 | Discharge: 2017-08-27 | Disposition: A | Discharge status: Home / Self Care | Payer: PRIVATE HEALTH INSURANCE | Source: Ambulatory Visit | Attending: Cardiovascular Disease | Admitting: Cardiovascular Disease

## 2017-08-27 DIAGNOSIS — I213 ST elevation (STEMI) myocardial infarction of unspecified site: Secondary | ICD-10-CM | POA: Diagnosis not present

## 2017-08-27 DIAGNOSIS — Z955 Presence of coronary angioplasty implant and graft: Secondary | ICD-10-CM

## 2017-08-27 DIAGNOSIS — I2121 ST elevation (STEMI) myocardial infarction involving left circumflex coronary artery: Secondary | ICD-10-CM

## 2017-08-27 NOTE — Progress Notes (Signed)
Daily Session Note  Patient Details  Name: Daniel Mcgee MRN: [id number] Date of Birth: 1960/11/17 Referring Provider:     CARDIAC REHAB PHASE II ORIENTATION from 07/22/2017 in [place]  Referring Provider  [name]       Encounter Date: 08/27/2017  Check In: Session Check In - 08/27/17 1100      Check-In   Location  [facility]    Staff Present  Aundra Dubin, RN, BSN;Gregory Luther Parody, BS, EP, Exercise Physiologist    Supervising physician immediately available to respond to emergencies  See telemetry face sheet for immediately available MD    Medication changes reported      No    Fall or balance concerns reported     No    Warm-up and Cool-down  Performed as group-led instruction    Resistance Training Performed  Yes    VAD Patient?  No      Pain Assessment   Currently in Pain?  No/denies    Pain Score  0-No pain    Multiple Pain Sites  No       Capillary Blood Glucose: No results found for this or any previous visit (from the past 24 hour(s)).    Social History   Tobacco Use  Smoking Status Never Smoker  Smokeless Tobacco Former Systems developer  . Types: Chew    Goals Met:  Independence with exercise equipment Exercise tolerated well No report of cardiac concerns or symptoms Strength training completed today  Goals Unmet:  Not Applicable  Comments: Check out 1200.   [name] is Medical Director for [facility] Cardiac and Pulmonary Rehab.

## 2017-08-30 ENCOUNTER — Encounter (HOSPITAL_COMMUNITY)
Admission: RE | Admit: 2017-08-30 | Discharge: 2017-08-30 | Disposition: A | Discharge status: Home / Self Care | Payer: PRIVATE HEALTH INSURANCE | Source: Ambulatory Visit | Attending: Cardiovascular Disease | Admitting: Cardiovascular Disease

## 2017-08-30 DIAGNOSIS — I213 ST elevation (STEMI) myocardial infarction of unspecified site: Secondary | ICD-10-CM | POA: Diagnosis not present

## 2017-08-30 DIAGNOSIS — Z955 Presence of coronary angioplasty implant and graft: Secondary | ICD-10-CM

## 2017-08-30 DIAGNOSIS — I2121 ST elevation (STEMI) myocardial infarction involving left circumflex coronary artery: Secondary | ICD-10-CM

## 2017-08-30 NOTE — Progress Notes (Signed)
Daily Session Note  Patient Details  Name: KRU ALLMAN MRN: [id number] Date of Birth: 22-Jul-1960 Referring Provider:     CARDIAC REHAB PHASE II ORIENTATION from 07/22/2017 in [place]  Referring Provider  [name]       Encounter Date: 08/30/2017  Check In: Session Check In - 08/30/17 1100      Check-In   Location  [facility]    Staff Present  Aundra Dubin, RN, BSN;Gregory Luther Parody, BS, EP, Exercise Physiologist    Supervising physician immediately available to respond to emergencies  See telemetry face sheet for immediately available MD    Medication changes reported      No    Fall or balance concerns reported     No    Warm-up and Cool-down  Performed as group-led instruction    Resistance Training Performed  Yes    VAD Patient?  No      Pain Assessment   Currently in Pain?  No/denies    Pain Score  0-No pain    Multiple Pain Sites  No       Capillary Blood Glucose: No results found for this or any previous visit (from the past 24 hour(s)).    Social History   Tobacco Use  Smoking Status Never Smoker  Smokeless Tobacco Former Systems developer  . Types: Chew    Goals Met:  Independence with exercise equipment Exercise tolerated well No report of cardiac concerns or symptoms Strength training completed today  Goals Unmet:  Not Applicable  Comments: Check out 1200.   [name] is Medical Director for [facility] Cardiac and Pulmonary Rehab.

## 2017-09-01 ENCOUNTER — Encounter (HOSPITAL_COMMUNITY)
Admission: RE | Admit: 2017-09-01 | Discharge: 2017-09-01 | Disposition: A | Discharge status: Home / Self Care | Payer: PRIVATE HEALTH INSURANCE | Source: Ambulatory Visit | Attending: Cardiovascular Disease | Admitting: Cardiovascular Disease

## 2017-09-01 DIAGNOSIS — I213 ST elevation (STEMI) myocardial infarction of unspecified site: Secondary | ICD-10-CM | POA: Diagnosis not present

## 2017-09-01 DIAGNOSIS — I2121 ST elevation (STEMI) myocardial infarction involving left circumflex coronary artery: Secondary | ICD-10-CM

## 2017-09-01 DIAGNOSIS — Z955 Presence of coronary angioplasty implant and graft: Secondary | ICD-10-CM

## 2017-09-01 NOTE — Progress Notes (Signed)
Daily Session Note  Patient Details  Name: DEUNDRA BARD MRN: [id number] Date of Birth: 06-28-1960 Referring Provider:     CARDIAC REHAB PHASE II ORIENTATION from 07/22/2017 in [place]  Referring Provider  [name]       Encounter Date: 09/01/2017  Check In: Session Check In - 09/01/17 1100      Check-In   Location  [facility]    Staff Present  Aundra Dubin, RN, BSN;Gregory Luther Parody, BS, EP, Exercise Physiologist    Supervising physician immediately available to respond to emergencies  See telemetry face sheet for immediately available MD    Medication changes reported      No    Fall or balance concerns reported     No    Warm-up and Cool-down  Performed as group-led instruction    Resistance Training Performed  Yes    VAD Patient?  No      Pain Assessment   Currently in Pain?  No/denies    Pain Score  0-No pain    Multiple Pain Sites  No       Capillary Blood Glucose: No results found for this or any previous visit (from the past 24 hour(s)).    Social History   Tobacco Use  Smoking Status Never Smoker  Smokeless Tobacco Former Systems developer  . Types: Chew    Goals Met:  Independence with exercise equipment Exercise tolerated well No report of cardiac concerns or symptoms Strength training completed today  Goals Unmet:  Not Applicable  Comments: Check out 1200.   [name] is Medical Director for [facility] Cardiac and Pulmonary Rehab.

## 2017-09-03 ENCOUNTER — Encounter (HOSPITAL_COMMUNITY)
Admission: RE | Admit: 2017-09-03 | Discharge: 2017-09-03 | Disposition: A | Discharge status: Home / Self Care | Payer: PRIVATE HEALTH INSURANCE | Source: Ambulatory Visit | Attending: Cardiovascular Disease | Admitting: Cardiovascular Disease

## 2017-09-03 DIAGNOSIS — I213 ST elevation (STEMI) myocardial infarction of unspecified site: Secondary | ICD-10-CM | POA: Diagnosis not present

## 2017-09-03 DIAGNOSIS — Z955 Presence of coronary angioplasty implant and graft: Secondary | ICD-10-CM

## 2017-09-03 DIAGNOSIS — I2121 ST elevation (STEMI) myocardial infarction involving left circumflex coronary artery: Secondary | ICD-10-CM

## 2017-09-03 NOTE — Progress Notes (Signed)
Daily Session Note  Patient Details  Name: ABDULMALIK Mcgee MRN: [id number] Date of Birth: [birth year] Referring Provider:     [place] from 07/22/2017 in [place]  Referring Provider  [name]       Encounter Date: 09/03/2017  Check In: Session Check In - 09/03/17 1140      Check-In   Location  [facility]    Staff Present  Suzanne Boron, BS, EP, Exercise Physiologist;Diane Coad, MS, EP, [facility], Exercise Physiologist    Supervising physician immediately available to respond to emergencies  See telemetry face sheet for immediately available MD    Medication changes reported      No    Fall or balance concerns reported     No    Tobacco Cessation  No Change    Warm-up and Cool-down  Performed as group-led instruction    Resistance Training Performed  Yes    VAD Patient?  No      Pain Assessment   Currently in Pain?  No/denies    Pain Score  0-No pain    Multiple Pain Sites  No       Capillary Blood Glucose: No results found for this or any previous visit (from the past 24 hour(s)).    Social History   Tobacco Use  Smoking Status Never Smoker  Smokeless Tobacco Former Systems developer  . Types: Chew    Goals Met:  Independence with exercise equipment Exercise tolerated well No report of cardiac concerns or symptoms Strength training completed today  Goals Unmet:  Not Applicable  Comments: Check out 1200   [name] is Medical Director for [place] and Pulmonary Rehab.

## 2017-09-06 ENCOUNTER — Encounter (HOSPITAL_COMMUNITY)
Admission: RE | Admit: 2017-09-06 | Discharge: 2017-09-06 | Disposition: A | Discharge status: Home / Self Care | Payer: PRIVATE HEALTH INSURANCE | Source: Ambulatory Visit | Attending: Cardiovascular Disease | Admitting: Cardiovascular Disease

## 2017-09-06 DIAGNOSIS — I213 ST elevation (STEMI) myocardial infarction of unspecified site: Secondary | ICD-10-CM | POA: Diagnosis not present

## 2017-09-06 NOTE — Progress Notes (Signed)
Daily Session Note  Patient Details  Name: Daniel Mcgee MRN: [id number] Date of Birth: 05/24/1960 Referring Provider:     CARDIAC REHAB PHASE II ORIENTATION from 07/22/2017 in [place]  Referring Provider  [name]       Encounter Date: 09/06/2017  Check In: Session Check In - 09/06/17 1100      Check-In   Location  [facility]    Staff Present  Lea Walbert Angelina Pih, MS, EP, [facility], Exercise Physiologist;Gregory Luther Parody, BS, EP, Exercise Physiologist    Supervising physician immediately available to respond to emergencies  See telemetry face sheet for immediately available MD    Medication changes reported      No    Fall or balance concerns reported     No    Tobacco Cessation  No Change    Warm-up and Cool-down  Performed as group-led instruction    Resistance Training Performed  Yes    VAD Patient?  No      Pain Assessment   Currently in Pain?  No/denies    Pain Score  0-No pain    Multiple Pain Sites  No       Capillary Blood Glucose: No results found for this or any previous visit (from the past 24 hour(s)).    Social History   Tobacco Use  Smoking Status Never Smoker  Smokeless Tobacco Former Systems developer  . Types: Chew    Goals Met:  Independence with exercise equipment Exercise tolerated well No report of cardiac concerns or symptoms Strength training completed today  Goals Unmet:  Not Applicable  Comments:  Check out: 1200   [name] is Medical Director for [place] and Pulmonary Rehab.

## 2017-09-08 ENCOUNTER — Encounter (HOSPITAL_COMMUNITY)
Admission: RE | Admit: 2017-09-08 | Discharge: 2017-09-08 | Disposition: A | Discharge status: Home / Self Care | Payer: PRIVATE HEALTH INSURANCE | Source: Ambulatory Visit | Attending: Cardiovascular Disease | Admitting: Cardiovascular Disease

## 2017-09-08 DIAGNOSIS — Z8249 Family history of ischemic heart disease and other diseases of the circulatory system: Secondary | ICD-10-CM | POA: Diagnosis not present

## 2017-09-08 DIAGNOSIS — E785 Hyperlipidemia, unspecified: Secondary | ICD-10-CM | POA: Diagnosis not present

## 2017-09-08 DIAGNOSIS — Z87891 Personal history of nicotine dependence: Secondary | ICD-10-CM | POA: Insufficient documentation

## 2017-09-08 DIAGNOSIS — Z955 Presence of coronary angioplasty implant and graft: Secondary | ICD-10-CM | POA: Insufficient documentation

## 2017-09-08 DIAGNOSIS — Z7902 Long term (current) use of antithrombotics/antiplatelets: Secondary | ICD-10-CM | POA: Diagnosis not present

## 2017-09-08 DIAGNOSIS — Z79899 Other long term (current) drug therapy: Secondary | ICD-10-CM | POA: Insufficient documentation

## 2017-09-08 DIAGNOSIS — Z7982 Long term (current) use of aspirin: Secondary | ICD-10-CM | POA: Diagnosis not present

## 2017-09-08 DIAGNOSIS — I213 ST elevation (STEMI) myocardial infarction of unspecified site: Secondary | ICD-10-CM | POA: Insufficient documentation

## 2017-09-08 DIAGNOSIS — I2121 ST elevation (STEMI) myocardial infarction involving left circumflex coronary artery: Secondary | ICD-10-CM

## 2017-09-08 NOTE — Progress Notes (Signed)
Cardiac Individual Treatment Plan  Patient Details  Name: Daniel Mcgee MRN: [id number] Date of Birth: Feb 21, 1961 Referring Provider:     CARDIAC REHAB PHASE II ORIENTATION from 07/22/2017 in [place]  Referring Provider  [name]       Initial Encounter Date:    CARDIAC REHAB PHASE II ORIENTATION from 07/22/2017 in [place]  Date  07/22/17  Referring Provider  [name]       Visit Diagnosis: ST elevation myocardial infarction involving left circumflex coronary artery [facility])  Status post coronary artery stent placement  Patient's Home Medications on Admission:  Current Outpatient Medications:  .  aspirin EC 81 MG tablet, Take 81 mg by mouth daily., Disp: , Rfl:  .  atorvastatin (LIPITOR) 80 MG tablet, Take 1 tablet (80 mg total) by mouth daily at 6 PM., Disp: 90 tablet, Rfl: 1 .  metoprolol tartrate (LOPRESSOR) 25 MG tablet, Take 0.5 tablets (12.5 mg total) by mouth 2 (two) times daily., Disp: 90 tablet, Rfl: 1 .  nitroGLYCERIN (NITROSTAT) 0.4 MG SL tablet, Place 1 tablet (0.4 mg total) under the tongue every 5 (five) minutes x 3 doses as needed for chest pain., Disp: 25 tablet, Rfl: 2 .  ticagrelor (BRILINTA) 90 MG TABS tablet, Take 1 tablet (90 mg total) by mouth 2 (two) times daily., Disp: 180 tablet, Rfl: 2  Past Medical History: Past Medical History:  Diagnosis Date  . Family history of premature CAD   . HLD (hyperlipidemia)   . STEMI (ST elevation myocardial infarction) ([place])    2/19 PCI/DES x1 to Lcx, mild disease in RCA, LAD, normal EF    Tobacco Use: Social History   Tobacco Use  Smoking Status Never Smoker  Smokeless Tobacco Former Systems developer  . Types: Chew    Labs: Recent Review Flowsheet Data    Labs for ITP Cardiac and Pulmonary Rehab Latest Ref Rng & Units 06/22/2017 06/23/2017   Cholestrol 0 - 200 mg/dL 150 137   LDLCALC 0 - 99 mg/dL 72 69   HDL >40 mg/dL 54 47   Trlycerides <150 mg/dL 118 104    Hemoglobin A1c 4.8 - 5.6 % 6.7(H) -   TCO2 22 - 32 mmol/L 21(L) -      Capillary Blood Glucose: Lab Results  Component Value Date   GLUCAP 123 (H) 06/23/2017     Exercise Target Goals:    Exercise Program Goal: Individual exercise prescription set using results from initial 6 min walk test and THRR while considering  patient's activity barriers and safety.   Exercise Prescription Goal: Starting with aerobic activity 30 plus minutes a day, 3 days per week for initial exercise prescription. Provide home exercise prescription and guidelines that participant acknowledges understanding prior to discharge.  Activity Barriers & Risk Stratification: Activity Barriers & Cardiac Risk Stratification - 07/22/17 1357      Activity Barriers & Cardiac Risk Stratification   Activity Barriers  None    Cardiac Risk Stratification  High       6 Minute Walk: 6 Minute Walk    Row Name 07/22/17 1357         6 Minute Walk   Phase  Initial     Distance  1450 feet     Distance % Change  0 %     Distance Feet Change  0 ft     Walk Time  6 minutes     # of Rest Breaks  0  MPH  2.74     METS  3.1     RPE  9     Perceived Dyspnea   8     VO2 Peak  15.8     Symptoms  No     Resting HR  78 bpm     Resting BP  122/82     Resting Oxygen Saturation   97 %     Exercise Oxygen Saturation  during 6 min walk  90 %     Max Ex. HR  120 bpm     Max Ex. BP  160/86     2 Minute Post BP  124/76        Oxygen Initial Assessment:   Oxygen Re-Evaluation:   Oxygen Discharge (Final Oxygen Re-Evaluation):   Initial Exercise Prescription: Initial Exercise Prescription - 07/22/17 1500      Elliptical   Level  --    Speed  --    Minutes  --    METs  --      Prescription Details   Frequency (times per week)  --    Duration  --      Intensity   THRR 40-80% of Max Heartrate  --    Ratings of Perceived Exertion  --    Perceived Dyspnea  --      Progression   Progression  --       Resistance Training   Training Prescription  --    Weight  --    Reps  --       Perform Capillary Blood Glucose checks as needed.  Exercise Prescription Changes:  Exercise Prescription Changes    Row Name 07/27/17 1306 08/05/17 1500 08/30/17 1400         Response to Exercise   Blood Pressure (Admit)  122/68  108/64  126/74     Blood Pressure (Exercise)  150/80  180/82  150/90     Blood Pressure (Exit)  110/54  100/62  98/70     Heart Rate (Admit)  59 bpm  64 bpm  52 bpm     Heart Rate (Exercise)  94 bpm  106 bpm  86 bpm     Heart Rate (Exit)  70 bpm  74 bpm  61 bpm     Rating of Perceived Exertion (Exercise)  '12  11  11     ' Duration  Progress to 30 minutes of  aerobic without signs/symptoms of physical distress  Progress to 30 minutes of  aerobic without signs/symptoms of physical distress  Progress to 30 minutes of  aerobic without signs/symptoms of physical distress     Intensity  THRR New 101-121-142  THRR New 104-124-143  THRR New [phone]       Progression   Progression  Continue to progress workloads to maintain intensity without signs/symptoms of physical distress.  Continue to progress workloads to maintain intensity without signs/symptoms of physical distress.  Continue to progress workloads to maintain intensity without signs/symptoms of physical distress.       Resistance Training   Training Prescription  Yes  Yes  Yes     Weight  '1  2  4     ' Reps  10-15  10-15  10-15       Treadmill   MPH  2.2  2.5  2.7     Grade  0  0  0     Minutes  '15  20  20     ' METs  2.6  2.9  3.06       Elliptical   Level  '1  1  1     ' Speed  2.5  38  38     Minutes  '15  15  15     ' METs  2.2  2.1  2.9        Exercise Comments:  Exercise Comments    Row Name 07/27/17 1308 08/05/17 1518 08/30/17 1427       Exercise Comments  Patient has just started CR and will be progressed in time.   Patient continues to do well in CR and has been doing very well on the stand up elliptical  machine. Patient has increased his speed on the treadmill as well to 2.5. Patients blood pressure is still somewhat high during exercise and will continued to be monitored throughout the remainder of the program. Patient will be progressedin time more since still only on his 7th session.   Patien tcontinues to do well in CR. Patient states that he feels that he has gained strength and stamina back for work if he is approved for returning. Patient also has increased his speed on the treadmill to 2.46mh while maintaining his level and watts on the stand up elliptical. Patient will continued to be monitored throughout the remainder of the program.         Exercise Goals and Review:  Exercise Goals    Row Name 07/22/17 1359             Exercise Goals   Increase Physical Activity  Yes       Intervention  Provide advice, education, support and counseling about physical activity/exercise needs.;Develop an individualized exercise prescription for aerobic and resistive training based on initial evaluation findings, risk stratification, comorbidities and participant's personal goals.       Expected Outcomes  Short Term: Attend rehab on a regular basis to increase amount of physical activity.       Increase Strength and Stamina  Yes       Intervention  Develop an individualized exercise prescription for aerobic and resistive training based on initial evaluation findings, risk stratification, comorbidities and participant's personal goals.;Provide advice, education, support and counseling about physical activity/exercise needs.       Expected Outcomes  Short Term: Increase workloads from initial exercise prescription for resistance, speed, and METs.       Able to understand and use rate of perceived exertion (RPE) scale  Yes       Intervention  Provide education and explanation on how to use RPE scale       Expected Outcomes  Short Term: Able to use RPE daily in rehab to express subjective intensity  level;Long Term:  Able to use RPE to guide intensity level when exercising independently       Able to understand and use Dyspnea scale  Yes       Intervention  Provide education and explanation on how to use Dyspnea scale       Expected Outcomes  Short Term: Able to use Dyspnea scale daily in rehab to express subjective sense of shortness of breath during exertion;Long Term: Able to use Dyspnea scale to guide intensity level when exercising independently       Knowledge and understanding of Target Heart Rate Range (THRR)  Yes       Intervention  Provide education and explanation of THRR including how the numbers were predicted and where they are located for reference  Expected Outcomes  Long Term: Able to use THRR to govern intensity when exercising independently;Short Term: Able to state/look up THRR;Short Term: Able to use daily as guideline for intensity in rehab       Able to check pulse independently  Yes       Intervention  Provide education and demonstration on how to check pulse in carotid and radial arteries.;Review the importance of being able to check your own pulse for safety during independent exercise       Expected Outcomes  Short Term: Able to explain why pulse checking is important during independent exercise;Long Term: Able to check pulse independently and accurately       Understanding of Exercise Prescription  Yes       Intervention  Provide education, explanation, and written materials on patient's individual exercise prescription       Expected Outcomes  Short Term: Able to explain program exercise prescription;Long Term: Able to explain home exercise prescription to exercise independently          Exercise Goals Re-Evaluation : Exercise Goals Re-Evaluation    Row Name 08/05/17 1517 08/30/17 1425           Exercise Goal Re-Evaluation   Exercise Goals Review  Increase Physical Activity;Able to understand and use rate of perceived exertion (RPE) scale;Knowledge and  understanding of Target Heart Rate Range (THRR);Understanding of Exercise Prescription;Able to understand and use Dyspnea scale  Increase Physical Activity;Able to understand and use rate of perceived exertion (RPE) scale;Knowledge and understanding of Target Heart Rate Range (THRR);Understanding of Exercise Prescription;Able to understand and use Dyspnea scale      Comments  Patient continues to do well in CR and has been doing very well on the stand up elliptical machine. Patient has increased his speed on the treadmill as well to 2.5. Patients blood pressure is still somewhat high during exercise and will continued to be monitored throughout the remainder of the program. Patient will be progressedin time more since still only on his 7th session.   Patien tcontinues to do well in CR. Patient states that he feels that he has gained strength and stamina back for work if he is approved for returning. Patient also has increased his speed on the treadmill to 2.37mh while maintaining his level and watts on the stand up elliptical. Patient will continued to be monitored throughout the remainder of the program.       Expected Outcomes  Patient wishes to get back to work and to be able to work without any health problems.   Patient wishes to get back to work and to be able to work without any health problems.           Discharge Exercise Prescription (Final Exercise Prescription Changes): Exercise Prescription Changes - 08/30/17 1400      Response to Exercise   Blood Pressure (Admit)  126/74    Blood Pressure (Exercise)  150/90    Blood Pressure (Exit)  98/70    Heart Rate (Admit)  52 bpm    Heart Rate (Exercise)  86 bpm    Heart Rate (Exit)  61 bpm    Rating of Perceived Exertion (Exercise)  11    Duration  Progress to 30 minutes of  aerobic without signs/symptoms of physical distress    Intensity  THRR New [phone]     Progression   Progression  Continue to progress workloads to maintain  intensity without signs/symptoms of physical distress.  Resistance Training   Training Prescription  Yes    Weight  4    Reps  10-15      Treadmill   MPH  2.7    Grade  0    Minutes  20    METs  3.06      Elliptical   Level  1    Speed  38    Minutes  15    METs  2.9       Nutrition:  Target Goals: Understanding of nutrition guidelines, daily intake of sodium <1577m, cholesterol <205m calories 30% from fat and 7% or less from saturated fats, daily to have 5 or more servings of fruits and vegetables.  Biometrics: Pre Biometrics - 07/22/17 1400      Pre Biometrics   Height  '6\' 3"'  (1.905 m)    Weight  212 lb 6.4 oz (96.3 kg)    Waist Circumference  37.5 inches    Hip Circumference  40 inches    Waist to Hip Ratio  0.94 %    BMI (Calculated)  26.55    Triceps Skinfold  6 mm    % Body Fat  21.3 %    Grip Strength  67.4 kg    Flexibility  18.83 in    Single Leg Stand  23 seconds        Nutrition Therapy Plan and Nutrition Goals: Nutrition Therapy & Goals - 08/12/17 1528      Personal Nutrition Goals   Nutrition Goal  For heart healthy choices add >50% of whole grains, make half their plate fruits and vegetables. Discuss the difference between starchy vegetables and leafy greens, and how leafy vegetables provide fiber, helps maintain healthy weight, helps control blood glucose, and lowers cholesterol.  Discuss purchasing fresh or frozen vegetable to reduce sodium and not to add grease, fat or sugar. Consume <18oz of red meat per week. Consume lean cuts of meats and very little of meats high in sodium and nitrates such as pork and lunch meats. Discussed portion control for all food groups.      Personal Goal #2  Patient continues to eat heart healthy. No salt, non fat products, no fired food, and lean meats. He plans to attend the RD appointment tomorrow.     Additional Goals?  No    Comments  Patient met with RD 08/12/17.      Intervention Plan   Intervention   Nutrition handout(s) given to patient.    Expected Outcomes  Short Term Goal: Understand basic principles of dietary content, such as calories, fat, sodium, cholesterol and nutrients.;Long Term Goal: Adherence to prescribed nutrition plan.       Nutrition Assessments: Nutrition Assessments - 07/22/17 1528      MEDFICTS Scores   Pre Score  6       Nutrition Goals Re-Evaluation: Nutrition Goals Re-Evaluation    [place] 09/08/17 1316             Goals   Current Weight  205 lb (93 kg)       Nutrition Goal  For heart healthy choices add >50% of whole grains, make half their plate fruits and vegetables. Discuss the difference between starchy vegetables and leafy greens, and how leafy vegetables provide fiber, helps maintain healthy weight, helps control blood glucose, and lowers cholesterol.  Discuss purchasing fresh or frozen vegetable to reduce sodium and not to add grease, fat or sugar. Consume <18oz of red meat per week. Consume lean  cuts of meats and very little of meats high in sodium and nitrates such as pork and lunch meats. Discussed portion control for all food groups.         Comment  Patient has lost 7.4 lbs since his initial visit. He says he is trying to eat healthy and eat smaller portions. Will continue to monitor for progress.        Expected Outcome  Patient will continue to work toward meeting his nutritional goals.          Personal Goal #2 Re-Evaluation   Personal Goal #2  Patient continues to eat heart healthy. No salt, non fat products, no fired food, and lean meats. He plans to attend the RD appointment tomorrow.           Nutrition Goals Discharge (Final Nutrition Goals Re-Evaluation): Nutrition Goals Re-Evaluation - 09/08/17 1316      Goals   Current Weight  205 lb (93 kg)    Nutrition Goal  For heart healthy choices add >50% of whole grains, make half their plate fruits and vegetables. Discuss the difference between starchy vegetables and leafy greens, and  how leafy vegetables provide fiber, helps maintain healthy weight, helps control blood glucose, and lowers cholesterol.  Discuss purchasing fresh or frozen vegetable to reduce sodium and not to add grease, fat or sugar. Consume <18oz of red meat per week. Consume lean cuts of meats and very little of meats high in sodium and nitrates such as pork and lunch meats. Discussed portion control for all food groups.      Comment  Patient has lost 7.4 lbs since his initial visit. He says he is trying to eat healthy and eat smaller portions. Will continue to monitor for progress.     Expected Outcome  Patient will continue to work toward meeting his nutritional goals.       Personal Goal #2 Re-Evaluation   Personal Goal #2  Patient continues to eat heart healthy. No salt, non fat products, no fired food, and lean meats. He plans to attend the RD appointment tomorrow.        Psychosocial: Target Goals: Acknowledge presence or absence of significant depression and/or stress, maximize coping skills, provide positive support system. Participant is able to verbalize types and ability to use techniques and skills needed for reducing stress and depression.  Initial Review & Psychosocial Screening: Initial Psych Review & Screening - 07/22/17 1531      Initial Review   Current issues with  None Identified      Family Dynamics   Good Support System?  Yes      Barriers   Psychosocial barriers to participate in program  There are no identifiable barriers or psychosocial needs.      Screening Interventions   Interventions  Encouraged to exercise    Expected Outcomes  Short Term goal: Identification and review with participant of any Quality of Life or Depression concerns found by scoring the questionnaire.;Long Term goal: The participant improves quality of Life and PHQ9 Scores as seen by post scores and/or verbalization of changes       Quality of Life Scores: Quality of Life - 07/22/17 1401      Quality  of Life Scores   Health/Function Pre  28.4 %    Socioeconomic Pre  28.29 %    Psych/Spiritual Pre  30 %    Family Pre  27.6 %    GLOBAL Pre  28.59 %  Scores of 19 and below usually indicate a poorer quality of life in these areas.  A difference of  2-3 points is a clinically meaningful difference.  A difference of 2-3 points in the total score of the Quality of Life Index has been associated with significant improvement in overall quality of life, self-image, physical symptoms, and general health in studies assessing change in quality of life.  PHQ-9: Recent Review Flowsheet Data    Depression screen [facility] 2/9 07/22/2017   Decreased Interest 0   Down, Depressed, Hopeless 0   PHQ - 2 Score 0   Altered sleeping 0   Tired, decreased energy 0   Change in appetite 0   Feeling bad or failure about yourself  0   Trouble concentrating 0   Moving slowly or fidgety/restless 0   Suicidal thoughts 0   PHQ-9 Score 0     Interpretation of Total Score  Total Score Depression Severity:  1-4 = Minimal depression, 5-9 = Mild depression, 10-14 = Moderate depression, 15-19 = Moderately severe depression, 20-27 = Severe depression   Psychosocial Evaluation and Intervention: Psychosocial Evaluation - 07/22/17 1531      Psychosocial Evaluation & Interventions   Interventions  Encouraged to exercise with the program and follow exercise prescription    Continue Psychosocial Services   No Follow up required       Psychosocial Re-Evaluation: Psychosocial Re-Evaluation    [place] Name 08/11/17 1316 09/08/17 1320           Psychosocial Re-Evaluation   Current issues with  None Identified  None Identified      Comments  Patient's initial QOL score was 28.59 and his PHQ-9 score was 0 with no psychosocial issues identified.   Patient's initial QOL score was 28.59 and his PHQ-9 score was 0 with no psychosocial issues identified.       Expected Outcomes  Patient will have no psychosocial issues  identified at discharge.   Patient will have no psychosocial issues identified at discharge.       Interventions  Stress management education;Encouraged to attend Cardiac Rehabilitation for the exercise;Relaxation education  Stress management education;Encouraged to attend Cardiac Rehabilitation for the exercise;Relaxation education      Continue Psychosocial Services   No Follow up required  No Follow up required         Psychosocial Discharge (Final Psychosocial Re-Evaluation): Psychosocial Re-Evaluation - 09/08/17 1320      Psychosocial Re-Evaluation   Current issues with  None Identified    Comments  Patient's initial QOL score was 28.59 and his PHQ-9 score was 0 with no psychosocial issues identified.     Expected Outcomes  Patient will have no psychosocial issues identified at discharge.     Interventions  Stress management education;Encouraged to attend Cardiac Rehabilitation for the exercise;Relaxation education    Continue Psychosocial Services   No Follow up required       Vocational Rehabilitation: Provide vocational rehab assistance to qualifying candidates.   Vocational Rehab Evaluation & Intervention: Vocational Rehab - 07/22/17 1516      Initial Vocational Rehab Evaluation & Intervention   Assessment shows need for Vocational Rehabilitation  No       Education: Education Goals: Education classes will be provided on a weekly basis, covering required topics. Participant will state understanding/return demonstration of topics presented.  Learning Barriers/Preferences: Learning Barriers/Preferences - 07/22/17 1515      Learning Barriers/Preferences   Learning Barriers  Hearing    Learning Preferences  Skilled  Demonstration;Individual Instruction;Group Instruction       Education Topics: Hypertension, Hypertension Reduction -Define heart disease and high blood pressure. Discus how high blood pressure affects the body and ways to reduce high blood  pressure.   Exercise and Your Heart -Discuss why it is important to exercise, the FITT principles of exercise, normal and abnormal responses to exercise, and how to exercise safely.   Angina -Discuss definition of angina, causes of angina, treatment of angina, and how to decrease risk of having angina.   Cardiac Medications -Review what the following cardiac medications are used for, how they affect the body, and side effects that may occur when taking the medications.  Medications include Aspirin, Beta blockers, calcium channel blockers, ACE Inhibitors, angiotensin receptor blockers, diuretics, digoxin, and antihyperlipidemics.   Congestive Heart Failure -Discuss the definition of CHF, how to live with CHF, the signs and symptoms of CHF, and how keep track of weight and sodium intake.   Heart Disease and Intimacy -Discus the effect sexual activity has on the heart, how changes occur during intimacy as we age, and safety during sexual activity.   Smoking Cessation / COPD -Discuss different methods to quit smoking, the health benefits of quitting smoking, and the definition of COPD.   CARDIAC REHAB PHASE II EXERCISE from 09/01/2017 in [place]  Date  07/29/17  Educator  DC  Instruction Review Code  2- Demonstrated Understanding      Nutrition I: Fats -Discuss the types of cholesterol, what cholesterol does to the heart, and how cholesterol levels can be controlled.   CARDIAC REHAB PHASE II EXERCISE from 09/01/2017 in [place]  Date  08/04/17  Educator  DC  Instruction Review Code  2- Demonstrated Understanding      Nutrition II: Labels -Discuss the different components of food labels and how to read food label   CARDIAC REHAB PHASE II EXERCISE from 09/01/2017 in [place]  Date  08/11/17  Educator  [place]  Instruction Review Code  2- Demonstrated Understanding      Heart Parts/Heart Disease and  PAD -Discuss the anatomy of the heart, the pathway of blood circulation through the heart, and these are affected by heart disease.   CARDIAC REHAB PHASE II EXERCISE from 09/01/2017 in [place]  Date  08/18/17  Educator  DJ  Instruction Review Code  2- Demonstrated Understanding      Stress I: Signs and Symptoms -Discuss the causes of stress, how stress may lead to anxiety and depression, and ways to limit stress.   CARDIAC REHAB PHASE II EXERCISE from 09/01/2017 in [place]  Date  08/25/17  Educator  [place]  Instruction Review Code  2- Demonstrated Understanding      Stress II: Relaxation -Discuss different types of relaxation techniques to limit stress.   CARDIAC REHAB PHASE II EXERCISE from 09/01/2017 in [place]  Date  09/01/17  Educator  [place]  Instruction Review Code  2- Demonstrated Understanding      Warning Signs of Stroke / TIA -Discuss definition of a stroke, what the signs and symptoms are of a stroke, and how to identify when someone is having stroke.   Knowledge Questionnaire Score: Knowledge Questionnaire Score - 07/22/17 1515      Knowledge Questionnaire Score   Pre Score  22/24       Core Components/Risk Factors/Patient Goals at Admission: Personal Goals and Risk Factors at Admission - 07/22/17 1528  Core Components/Risk Factors/Patient Goals on Admission    Weight Management  Weight Maintenance    Personal Goal Other  Yes    Personal Goal  To get back to work.     Intervention  Attend CR program 3 x week and supplement with home exercise 2 x week.     Expected Outcomes  Reach personal goals.        Core Components/Risk Factors/Patient Goals Review:  Goals and Risk Factor Review    Row Name 08/11/17 1314 09/08/17 1318           Core Components/Risk Factors/Patient Goals Review   Personal Goals Review  Weight Management/Obesity Get back to work; work without any problems.    Weight Management/Obesity Get back to work; working without any problesm.       Review  Patient has completed 10 sessions losing 9.4 lbs since he started. He is doing well in the program with progression. He says he has cut out all sugar from his diet. He says he feels stronger and hopes to get stronger as he continues the program. Will continue to monitor.   Patient has complted 22 sessions losing 7.4 lbs since his initial visit. He continues to do well in the program with progression. He continues to say he feels stronger and feels like doing more around the house. He has more energy and feels more like he is able now to return to his job but plans to complete the program before he returns. Will continue to monitor for progress.       Expected Outcomes  Patient will continue to attend sessions and complete the program meeting his personal needs.   Patient will continue to attend sessions and complete the program meeting his personal needs.          Core Components/Risk Factors/Patient Goals at Discharge (Final Review):  Goals and Risk Factor Review - 09/08/17 1318      Core Components/Risk Factors/Patient Goals Review   Personal Goals Review  Weight Management/Obesity Get back to work; working without any problesm.     Review  Patient has complted 22 sessions losing 7.4 lbs since his initial visit. He continues to do well in the program with progression. He continues to say he feels stronger and feels like doing more around the house. He has more energy and feels more like he is able now to return to his job but plans to complete the program before he returns. Will continue to monitor for progress.     Expected Outcomes  Patient will continue to attend sessions and complete the program meeting his personal needs.        ITP Comments:   Comments: ITP 30 Day REVIEW Patient doing well in the program. Will continue to monitor for progress.

## 2017-09-08 NOTE — Progress Notes (Signed)
Daily Session Note  Patient Details  Name: Daniel Mcgee MRN: [id number] Date of Birth: 08/09/1960 Referring Provider:     CARDIAC REHAB PHASE II ORIENTATION from 07/22/2017 in [place]  Referring Provider  [name]       Encounter Date: 09/08/2017  Check In: Session Check In - 09/08/17 1106      Check-In   Location  [facility]    Staff Present  Diane Angelina Pih, MS, EP, [facility], Exercise Physiologist;Danialle Dement Luther Parody, BS, EP, Exercise Physiologist;Debra Wynetta Emery, RN, BSN    Supervising physician immediately available to respond to emergencies  See telemetry face sheet for immediately available MD    Medication changes reported      No    Fall or balance concerns reported     No    Warm-up and Cool-down  Performed as group-led instruction    Resistance Training Performed  Yes    VAD Patient?  No      Pain Assessment   Currently in Pain?  No/denies    Pain Score  0-No pain    Multiple Pain Sites  No       Capillary Blood Glucose: No results found for this or any previous visit (from the past 24 hour(s)).    Social History   Tobacco Use  Smoking Status Never Smoker  Smokeless Tobacco Former Systems developer  . Types: Chew    Goals Met:  Independence with exercise equipment Exercise tolerated well No report of cardiac concerns or symptoms Strength training completed today  Goals Unmet:  Not Applicable  Comments: Check out 1200   [name] is Medical Director for [place] and Pulmonary Rehab.

## 2017-09-10 ENCOUNTER — Encounter (HOSPITAL_COMMUNITY)
Admission: RE | Admit: 2017-09-10 | Discharge: 2017-09-10 | Disposition: A | Discharge status: Home / Self Care | Payer: PRIVATE HEALTH INSURANCE | Source: Ambulatory Visit | Attending: Cardiovascular Disease | Admitting: Cardiovascular Disease

## 2017-09-10 DIAGNOSIS — I2121 ST elevation (STEMI) myocardial infarction involving left circumflex coronary artery: Secondary | ICD-10-CM

## 2017-09-10 DIAGNOSIS — I213 ST elevation (STEMI) myocardial infarction of unspecified site: Secondary | ICD-10-CM | POA: Diagnosis not present

## 2017-09-10 DIAGNOSIS — Z955 Presence of coronary angioplasty implant and graft: Secondary | ICD-10-CM

## 2017-09-10 NOTE — Progress Notes (Signed)
Daily Session Note  Patient Details  Name: Daniel Mcgee MRN: [id number] Date of Birth: 03-15-1961 Referring Provider:     CARDIAC REHAB PHASE II ORIENTATION from 07/22/2017 in [place]  Referring Provider  [name]       Encounter Date: 09/10/2017  Check In: Session Check In - 09/10/17 1111      Check-In   Location  [facility]    Staff Present  Russella Dar, MS, EP, [facility], Exercise Physiologist;Kynsli Haapala Luther Parody, BS, EP, Exercise Physiologist    Supervising physician immediately available to respond to emergencies  See telemetry face sheet for immediately available MD    Medication changes reported      No    Fall or balance concerns reported     No    Warm-up and Cool-down  Performed as group-led instruction    Resistance Training Performed  Yes    VAD Patient?  No      Pain Assessment   Currently in Pain?  No/denies    Pain Score  0-No pain    Multiple Pain Sites  No       Capillary Blood Glucose: No results found for this or any previous visit (from the past 24 hour(s)).    Social History   Tobacco Use  Smoking Status Never Smoker  Smokeless Tobacco Former Systems developer  . Types: Chew    Goals Met:  Independence with exercise equipment Exercise tolerated well No report of cardiac concerns or symptoms Strength training completed today  Goals Unmet:  Not Applicable  Comments: Check out 1200   [name] is Medical Director for [place] and Pulmonary Rehab.

## 2017-09-13 ENCOUNTER — Encounter (HOSPITAL_COMMUNITY)
Admission: RE | Admit: 2017-09-13 | Discharge: 2017-09-13 | Disposition: A | Discharge status: Home / Self Care | Payer: PRIVATE HEALTH INSURANCE | Source: Ambulatory Visit | Attending: Cardiovascular Disease | Admitting: Cardiovascular Disease

## 2017-09-13 DIAGNOSIS — I213 ST elevation (STEMI) myocardial infarction of unspecified site: Secondary | ICD-10-CM | POA: Diagnosis not present

## 2017-09-13 DIAGNOSIS — Z955 Presence of coronary angioplasty implant and graft: Secondary | ICD-10-CM

## 2017-09-13 DIAGNOSIS — I2121 ST elevation (STEMI) myocardial infarction involving left circumflex coronary artery: Secondary | ICD-10-CM

## 2017-09-13 NOTE — Progress Notes (Signed)
Daily Session Note  Patient Details  Name: Daniel Mcgee MRN: [id number] Date of Birth: 10/10/1960 Referring Provider:     [place] from 07/22/2017 in [place]  Referring Provider  [name]       Encounter Date: 09/13/2017  Check In: Session Check In - 09/13/17 1100      Check-In   Location  [facility]    Staff Present  Diane Angelina Pih, MS, EP, [facility], Exercise Physiologist;Gregory Luther Parody, BS, EP, Exercise Physiologist;Aubree Doody Wynetta Emery, RN, BSN    Supervising physician immediately available to respond to emergencies  See telemetry face sheet for immediately available MD    Medication changes reported      No    Fall or balance concerns reported     No    Warm-up and Cool-down  Performed as group-led instruction    Resistance Training Performed  Yes    VAD Patient?  No      Pain Assessment   Currently in Pain?  No/denies    Pain Score  0-No pain    Multiple Pain Sites  No       Capillary Blood Glucose: No results found for this or any previous visit (from the past 24 hour(s)).    Social History   Tobacco Use  Smoking Status Never Smoker  Smokeless Tobacco Former Systems developer  . Types: Chew    Goals Met:  Independence with exercise equipment Exercise tolerated well No report of cardiac concerns or symptoms Strength training completed today  Goals Unmet:  Not Applicable  Comments: Check out 1200.   [name] is Medical Director for [facility] Cardiac and Pulmonary Rehab.

## 2017-09-15 ENCOUNTER — Other Ambulatory Visit: Payer: Self-pay | Admitting: Cardiology

## 2017-09-15 ENCOUNTER — Encounter (HOSPITAL_COMMUNITY)
Admission: RE | Admit: 2017-09-15 | Discharge: 2017-09-15 | Disposition: A | Discharge status: Home / Self Care | Payer: PRIVATE HEALTH INSURANCE | Source: Ambulatory Visit | Attending: Cardiovascular Disease | Admitting: Cardiovascular Disease

## 2017-09-15 DIAGNOSIS — Z955 Presence of coronary angioplasty implant and graft: Secondary | ICD-10-CM

## 2017-09-15 DIAGNOSIS — I213 ST elevation (STEMI) myocardial infarction of unspecified site: Secondary | ICD-10-CM | POA: Diagnosis not present

## 2017-09-15 DIAGNOSIS — I2121 ST elevation (STEMI) myocardial infarction involving left circumflex coronary artery: Secondary | ICD-10-CM

## 2017-09-15 NOTE — Progress Notes (Signed)
Daily Session Note  Patient Details  Name: BURLIN MCNAIR MRN: [id number] Date of Birth: 1960-06-22 Referring Provider:     CARDIAC REHAB PHASE II ORIENTATION from 07/22/2017 in [place]  Referring Provider  [name]       Encounter Date: 09/15/2017  Check In: Session Check In - 09/15/17 1100      Check-In   Location  [facility]    Staff Present  Diane Angelina Pih, MS, EP, [facility], Exercise Physiologist;Gregory Luther Parody, BS, EP, Exercise Physiologist;Aditi Rovira Wynetta Emery, RN, BSN    Supervising physician immediately available to respond to emergencies  See telemetry face sheet for immediately available MD    Medication changes reported      No    Fall or balance concerns reported     No    Warm-up and Cool-down  Performed as group-led instruction    Resistance Training Performed  Yes    VAD Patient?  No      Pain Assessment   Currently in Pain?  No/denies    Pain Score  0-No pain    Multiple Pain Sites  No       Capillary Blood Glucose: No results found for this or any previous visit (from the past 24 hour(s)).    Social History   Tobacco Use  Smoking Status Never Smoker  Smokeless Tobacco Former Systems developer  . Types: Chew    Goals Met:  Independence with exercise equipment Exercise tolerated well No report of cardiac concerns or symptoms Strength training completed today  Goals Unmet:  Not Applicable  Comments: Check out 1200.   [name] is Medical Director for [facility] Cardiac and Pulmonary Rehab.

## 2017-09-16 ENCOUNTER — Emergency Department (HOSPITAL_COMMUNITY): Payer: PRIVATE HEALTH INSURANCE

## 2017-09-16 ENCOUNTER — Inpatient Hospital Stay (HOSPITAL_COMMUNITY)
Admission: EM | Admit: 2017-09-16 | Discharge: 2017-09-17 | DRG: 390 | Disposition: A | Discharge status: Home / Self Care | Payer: PRIVATE HEALTH INSURANCE | Attending: Internal Medicine | Admitting: Internal Medicine

## 2017-09-16 ENCOUNTER — Other Ambulatory Visit: Payer: Self-pay

## 2017-09-16 ENCOUNTER — Encounter (HOSPITAL_COMMUNITY): Payer: Self-pay | Admitting: *Deleted

## 2017-09-16 ENCOUNTER — Telehealth: Payer: Self-pay | Admitting: *Deleted

## 2017-09-16 DIAGNOSIS — I880 Nonspecific mesenteric lymphadenitis: Secondary | ICD-10-CM | POA: Diagnosis present

## 2017-09-16 DIAGNOSIS — E785 Hyperlipidemia, unspecified: Secondary | ICD-10-CM | POA: Diagnosis present

## 2017-09-16 DIAGNOSIS — K56600 Partial intestinal obstruction, unspecified as to cause: Principal | ICD-10-CM | POA: Diagnosis present

## 2017-09-16 DIAGNOSIS — K56609 Unspecified intestinal obstruction, unspecified as to partial versus complete obstruction: Secondary | ICD-10-CM | POA: Diagnosis present

## 2017-09-16 DIAGNOSIS — I2510 Atherosclerotic heart disease of native coronary artery without angina pectoris: Secondary | ICD-10-CM | POA: Diagnosis present

## 2017-09-16 DIAGNOSIS — E118 Type 2 diabetes mellitus with unspecified complications: Secondary | ICD-10-CM | POA: Diagnosis not present

## 2017-09-16 DIAGNOSIS — Z885 Allergy status to narcotic agent status: Secondary | ICD-10-CM | POA: Diagnosis not present

## 2017-09-16 DIAGNOSIS — Z87891 Personal history of nicotine dependence: Secondary | ICD-10-CM

## 2017-09-16 DIAGNOSIS — I252 Old myocardial infarction: Secondary | ICD-10-CM

## 2017-09-16 DIAGNOSIS — A084 Viral intestinal infection, unspecified: Secondary | ICD-10-CM | POA: Diagnosis not present

## 2017-09-16 DIAGNOSIS — E119 Type 2 diabetes mellitus without complications: Secondary | ICD-10-CM | POA: Diagnosis present

## 2017-09-16 DIAGNOSIS — Z79899 Other long term (current) drug therapy: Secondary | ICD-10-CM | POA: Diagnosis not present

## 2017-09-16 DIAGNOSIS — Z955 Presence of coronary angioplasty implant and graft: Secondary | ICD-10-CM

## 2017-09-16 LAB — LIPASE, BLOOD: Lipase: 28 U/L (ref 11–51)

## 2017-09-16 LAB — COMPREHENSIVE METABOLIC PANEL
ALK PHOS: 67 U/L (ref 38–126)
ALT: 47 U/L (ref 17–63)
AST: 45 U/L — AB (ref 15–41)
Albumin: 4.6 g/dL (ref 3.5–5.0)
Anion gap: 12 (ref 5–15)
BILIRUBIN TOTAL: 1.3 mg/dL — AB (ref 0.3–1.2)
BUN: 11 mg/dL (ref 6–20)
CALCIUM: 9.7 mg/dL (ref 8.9–10.3)
CO2: 24 mmol/L (ref 22–32)
Chloride: 104 mmol/L (ref 101–111)
Creatinine, Ser: 0.76 mg/dL (ref 0.61–1.24)
GFR calc Af Amer: >60 mL/min (ref >60)
GLUCOSE: 122 mg/dL — AB (ref 65–99)
Potassium: 4.2 mmol/L (ref 3.5–5.1)
Sodium: 140 mmol/L (ref 135–145)
TOTAL PROTEIN: 7.7 g/dL (ref 6.5–8.1)

## 2017-09-16 LAB — CBC WITH DIFFERENTIAL/PLATELET
BASOS PCT: 0 %
Basophils Absolute: 0 10*3/uL (ref 0.0–0.1)
EOS ABS: 0.2 10*3/uL (ref 0.0–0.7)
Eosinophils Relative: 2 %
HCT: 47 % (ref 39.0–52.0)
HEMOGLOBIN: 15.8 g/dL (ref 13.0–17.0)
Lymphocytes Relative: 21 %
Lymphs Abs: 1.7 10*3/uL (ref 0.7–4.0)
MCH: 30.2 pg (ref 26.0–34.0)
MCHC: 33.6 g/dL (ref 30.0–36.0)
MCV: 89.7 fL (ref 78.0–100.0)
MONO ABS: 0.7 10*3/uL (ref 0.1–1.0)
MONOS PCT: 8 %
NEUTROS PCT: 69 %
Neutro Abs: 5.6 10*3/uL (ref 1.7–7.7)
Platelets: 203 10*3/uL (ref 150–400)
RBC: 5.24 MIL/uL (ref 4.22–5.81)
RDW: 13.3 % (ref 11.5–15.5)
WBC: 8.1 10*3/uL (ref 4.0–10.5)

## 2017-09-16 LAB — I-STAT TROPONIN, ED: Troponin i, poc: 0.01 ng/mL (ref 0.00–0.08)

## 2017-09-16 LAB — HEMOGLOBIN A1C
HEMOGLOBIN A1C: 5.7 % — AB (ref 4.8–5.6)
Mean Plasma Glucose: 116.89 mg/dL

## 2017-09-16 MED ORDER — ASPIRIN 81 MG PO CHEW
243.0000 mg | CHEWABLE_TABLET | Freq: Once | ORAL | Status: AC
  Administered 2017-09-16: 243 mg via ORAL
  Filled 2017-09-16: qty 3

## 2017-09-16 MED ORDER — IOPAMIDOL (ISOVUE-300) INJECTION 61%: 30.0000 mL | Freq: Once | INTRAVENOUS | Status: DC | PRN

## 2017-09-16 MED ORDER — TICAGRELOR 90 MG PO TABS
90.0000 mg | ORAL_TABLET | Freq: Two times a day (BID) | ORAL | Status: DC
  Administered 2017-09-16 – 2017-09-17 (×2): 90 mg via ORAL
  Filled 2017-09-16 (×2): qty 1

## 2017-09-16 MED ORDER — FAMOTIDINE IN NACL 20-0.9 MG/50ML-% IV SOLN
20.0000 mg | Freq: Once | INTRAVENOUS | Status: AC
  Administered 2017-09-16: 20 mg via INTRAVENOUS
  Filled 2017-09-16: qty 50

## 2017-09-16 MED ORDER — ASPIRIN EC 81 MG PO TBEC
81.0000 mg | DELAYED_RELEASE_TABLET | Freq: Every day | ORAL | Status: DC
  Administered 2017-09-17: 81 mg via ORAL
  Filled 2017-09-16: qty 1

## 2017-09-16 MED ORDER — METOPROLOL TARTRATE 25 MG PO TABS
12.5000 mg | ORAL_TABLET | Freq: Two times a day (BID) | ORAL | Status: DC
  Administered 2017-09-17: 12.5 mg via ORAL
  Filled 2017-09-16 (×2): qty 1

## 2017-09-16 MED ORDER — ATORVASTATIN CALCIUM 40 MG PO TABS
80.0000 mg | ORAL_TABLET | Freq: Every day | ORAL | Status: DC
  Administered 2017-09-16 – 2017-09-17 (×2): 80 mg via ORAL
  Filled 2017-09-16 (×2): qty 2

## 2017-09-16 MED ORDER — ONDANSETRON HCL 4 MG PO TABS: 4.0000 mg | ORAL_TABLET | Freq: Four times a day (QID) | ORAL | Status: DC | PRN

## 2017-09-16 MED ORDER — ACETAMINOPHEN 650 MG RE SUPP: 650.0000 mg | Freq: Four times a day (QID) | RECTAL | Status: DC | PRN

## 2017-09-16 MED ORDER — ACETAMINOPHEN 325 MG PO TABS: 650.0000 mg | ORAL_TABLET | Freq: Four times a day (QID) | ORAL | Status: DC | PRN

## 2017-09-16 MED ORDER — ENOXAPARIN SODIUM 40 MG/0.4ML ~~LOC~~ SOLN
40.0000 mg | SUBCUTANEOUS | Status: DC
  Filled 2017-09-16: qty 0.4

## 2017-09-16 MED ORDER — MORPHINE SULFATE (PF) 4 MG/ML IV SOLN
4.0000 mg | Freq: Once | INTRAVENOUS | Status: AC
  Administered 2017-09-16: 4 mg via INTRAVENOUS
  Filled 2017-09-16: qty 1

## 2017-09-16 MED ORDER — ONDANSETRON HCL 4 MG/2ML IJ SOLN: 4.0000 mg | Freq: Four times a day (QID) | INTRAMUSCULAR | Status: DC | PRN

## 2017-09-16 MED ORDER — ONDANSETRON HCL 4 MG/2ML IJ SOLN
4.0000 mg | Freq: Once | INTRAMUSCULAR | Status: AC
  Administered 2017-09-16: 4 mg via INTRAVENOUS
  Filled 2017-09-16: qty 2

## 2017-09-16 MED ORDER — SODIUM CHLORIDE 0.9 % IV SOLN
INTRAVENOUS | Status: DC
  Administered 2017-09-16 – 2017-09-17 (×2): via INTRAVENOUS

## 2017-09-16 MED ORDER — FENTANYL CITRATE (PF) 100 MCG/2ML IJ SOLN
25.0000 ug | INTRAMUSCULAR | Status: DC | PRN
Start: 2017-09-16 — End: 2017-09-17
  Administered 2017-09-16: 25 ug via INTRAVENOUS
  Filled 2017-09-16 (×2): qty 2

## 2017-09-16 MED ORDER — IOPAMIDOL (ISOVUE-300) INJECTION 61%
100.0000 mL | Freq: Once | INTRAVENOUS | Status: AC | PRN
  Administered 2017-09-16: 100 mL via INTRAVENOUS

## 2017-09-16 NOTE — Telephone Encounter (Signed)
Pt c/o chest pain for the last 2 days - says he feels bloated in his stomach - denies any weight gain/SOB/dizzniess - doesn't know what BP/HR has been running - went to pcp yesterday and labs were drawn but no results of those yet. Pt advised to get evaluated at ED with active chest pain (no extender appt available today) pt says he would also take NTG - has f/u with Dr Bronson Ing 6/24 - will forward as Juluis Rainier

## 2017-09-16 NOTE — Consult Note (Signed)
[facility] Consult  Reason for Consult: SBO  Referring Physician:  Dr.  Carles Collet   Chief Complaint    Chest Pain      Daniel Mcgee is a 57 y.o. male.  HPI: Daniel Mcgee is a 57 yo with a recent STEMI s/p stent placement 06/2017 who is on aspirin/ brilinta who presented to the Ed with a 3  Day history of epigastric/ mid abdominal pain. He was worked up for possible cardiac issues, and ultimately underwent a CT in the ED which is concerning for possible SBO obstruction.  He denies any nausea/vomiting, and has been having regular daily to BID BMs. He reports no diarrhea, no recent viral illness, and no sick contacts. He has no personal or family history of IBD. He did suffer from some abdominal pain in the 1990s that resolved and no etiology was identified.    He tried to take pepto-bismol and other at home medications without success to treat the pain.   Past Medical History:  Diagnosis Date  . Family history of premature CAD   . HLD (hyperlipidemia)   . STEMI (ST elevation myocardial infarction) ([place])    2/19 PCI/DES x1 to Lcx, mild disease in RCA, LAD, normal EF    Past Surgical History:  Procedure Laterality Date  . CORONARY STENT PLACEMENT    . CORONARY/GRAFT ACUTE MI REVASCULARIZATION N/A 06/22/2017   Procedure: Coronary/Graft Acute MI Revascularization;  Surgeon: Sherren Mocha, MD;  Location: [place] [facility];  Service: Cardiovascular;  Laterality: N/A;  . INGUINAL HERNIA REPAIR Right 2016  . LEFT HEART CATH AND CORONARY ANGIOGRAPHY N/A 06/22/2017   Procedure: LEFT HEART CATH AND CORONARY ANGIOGRAPHY;  Surgeon: Sherren Mocha, MD;  Location: [place] [facility];  Service: Cardiovascular;  Laterality: N/A;    Family History  Problem Relation Age of Onset  . CAD Brother 25  . CAD Brother   . Heart attack Mother     Social History   Tobacco Use  . Smoking status: Never Smoker  . Smokeless tobacco: Former Systems developer    Types: Chew  Substance Use Topics  .  Alcohol use: Yes    Alcohol/week: 0.6 oz    Types: 1 Cans of beer per week    Comment: occasionally   . Drug use: No    Medications:  I have reviewed the patient's current medications. Prior to Admission:  Medications Prior to Admission  Medication Sig Dispense Refill Last Dose  . aspirin EC 81 MG tablet Take 81 mg by mouth daily.   09/16/2017 at Unknown time  . aspirin-sod bicarb-citric acid (ALKA-SELTZER) 325 MG TBEF tablet Take 325 mg by mouth every 6 (six) hours as needed (For indigestion).   09/16/2017 at Unknown time  . atorvastatin (LIPITOR) 80 MG tablet Take 1 tablet (80 mg total) by mouth daily at 6 PM. 90 tablet 1 09/15/2017 at Unknown time  . bismuth subsalicylate (PEPTO BISMOL) 262 MG/15ML suspension Take 30 mLs by mouth every 6 (six) hours as needed for indigestion.   Past Week at Unknown time  . metoprolol tartrate (LOPRESSOR) 25 MG tablet Take 0.5 tablets (12.5 mg total) by mouth 2 (two) times daily. 90 tablet 1 09/16/2017 at 0730  . nitroGLYCERIN (NITROSTAT) 0.4 MG SL tablet Place 1 tablet (0.4 mg total) under the tongue every 5 (five) minutes x 3 doses as needed for chest pain. 25 tablet 2 09/16/2017 at Unknown time  . ranitidine (ZANTAC) 150 MG tablet Take 150 mg by mouth daily.  09/16/2017 at Unknown time  . ticagrelor (BRILINTA) 90 MG TABS tablet Take 1 tablet (90 mg total) by mouth 2 (two) times daily. 180 tablet 2 09/16/2017 at Unknown time   Scheduled: . [START ON 09/17/2017] aspirin EC  81 mg Oral Daily  . atorvastatin  80 mg Oral q1800  . enoxaparin (LOVENOX) injection  40 mg Subcutaneous Q24H  . metoprolol tartrate  12.5 mg Oral BID  . ticagrelor  90 mg Oral BID   Continuous: . sodium chloride 75 mL/hr at 09/16/17 1605   XBM:[id number] **OR** acetaminophen, fentaNYL (SUBLIMAZE) injection, ondansetron **OR** ondansetron (ZOFRAN) IV  Allergies  Allergen Reactions  . Hydrocodone Itching     ROS:  A comprehensive review of systems was negative except for:  Gastrointestinal: positive for abdominal pain  Blood pressure (!) 145/90, pulse (!) 47, temperature 97.8 F (36.6 C), temperature source Oral, resp. rate 16, height 6' 3" (1.905 m), weight 200 lb (90.7 kg), SpO2 100 %. Physical Exam  Constitutional: He is oriented to person, place, and time. He appears well-developed.  HENT:  Head: Normocephalic.  Eyes: Pupils are equal, round, and reactive to light.  Neck: Normal range of motion.  Cardiovascular: Regular rhythm and normal pulses.  Pulmonary/Chest: Effort normal.  Abdominal: Soft. He exhibits no distension. There is no tenderness.  Musculoskeletal:       Right lower leg: Normal.       Left lower leg: Normal.  Neurological: He is alert and oriented to person, place, and time.  Skin: Skin is warm and dry.  Psychiatric: He has a normal mood and affect. His behavior is normal. His mood appears not anxious. He is not agitated.  Vitals reviewed.   Results: Results for orders placed or performed during the hospital encounter of 09/16/17 (from the past 48 hour(s))  I-stat troponin, ED     Status: None   Collection Time: 09/16/17  9:22 AM  Result Value Ref Range   Troponin i, poc 0.01 0.00 - 0.08 ng/mL   Comment 3            Comment: Due to the release kinetics of cTnI, a negative result within the first hours of the onset of symptoms does not rule out myocardial infarction with certainty. If myocardial infarction is still suspected, repeat the test at appropriate intervals.   Comprehensive metabolic panel     Status: Abnormal   Collection Time: 09/16/17  9:44 AM  Result Value Ref Range   Sodium 140 135 - 145 mmol/L   Potassium 4.2 3.5 - 5.1 mmol/L   Chloride 104 101 - 111 mmol/L   CO2 24 22 - 32 mmol/L   Glucose, Bld 122 (H) 65 - 99 mg/dL   BUN 11 6 - 20 mg/dL   Creatinine, Ser 0.76 0.61 - 1.24 mg/dL   Calcium 9.7 8.9 - 10.3 mg/dL   Total Protein 7.7 6.5 - 8.1 g/dL   Albumin 4.6 3.5 - 5.0 g/dL   AST 45 (H) 15 - 41 U/L   ALT  47 17 - 63 U/L   Alkaline Phosphatase 67 38 - 126 U/L   Total Bilirubin 1.3 (H) 0.3 - 1.2 mg/dL   GFR calc non Af Amer >60 >60 mL/min   GFR calc Af Amer >60 >60 mL/min    Comment: (NOTE) The eGFR has been calculated using the CKD EPI equation. This calculation has not been validated in all clinical situations. eGFR's persistently <60 mL/min signify possible Chronic Kidney Disease.    Anion  gap 12 5 - 15    Comment: Performed at [facility], [address], [place], [place] [address]  Lipase, blood     Status: None   Collection Time: 09/16/17  9:44 AM  Result Value Ref Range   Lipase 28 11 - 51 U/L    Comment: Performed at [facility], [address] [place], [place] [address]  CBC with Differential/Platelet     Status: None   Collection Time: 09/16/17  9:58 AM  Result Value Ref Range   WBC 8.1 4.0 - 10.5 K/uL   RBC 5.24 4.22 - 5.81 MIL/uL   Hemoglobin 15.8 13.0 - 17.0 g/dL   HCT 47.0 39.0 - 52.0 %   MCV 89.7 78.0 - 100.0 fL   MCH 30.2 26.0 - 34.0 pg   MCHC 33.6 30.0 - 36.0 g/dL   RDW 13.3 11.5 - 15.5 %   Platelets 203 150 - 400 K/uL   Neutrophils Relative % 69 %   Neutro Abs 5.6 1.7 - 7.7 K/uL   Lymphocytes Relative 21 %   Lymphs Abs 1.7 0.7 - 4.0 K/uL   Monocytes Relative 8 %   Monocytes Absolute 0.7 0.1 - 1.0 K/uL   Eosinophils Relative 2 %   Eosinophils Absolute 0.2 0.0 - 0.7 K/uL   Basophils Relative 0 %   Basophils Absolute 0.0 0.0 - 0.1 K/uL    Comment: Performed at [facility], [address], [place], [place] [address]    Personally reviewed imaging= poor quality due to the axial and coronals being split and unable to scroll through the whole image - dilated small bowel with fecalization on left, some thickened small bowel, cannot appreciate specific after of transition but this is difficult due to the poor quality; there is some lymph nodes with surround edema/ fat stranding   Ct Abdomen Pelvis W Contrast  Result Date: 09/16/2017 CLINICAL DATA:  Epigastric  abdominal pain EXAM: CT ABDOMEN AND PELVIS WITH CONTRAST TECHNIQUE: Multidetector CT imaging of the abdomen and pelvis was performed using the standard protocol following bolus administration of intravenous contrast. CONTRAST:  182m ISOVUE-300 IOPAMIDOL (ISOVUE-300) INJECTION 61% COMPARISON:  None. FINDINGS: Lower chest: Minor dependent basilar atelectasis. No lower lobe pneumonia. No pericardial effusion. Native coronary atherosclerosis noted. Hepatobiliary: No focal liver abnormality is seen. No gallstones, gallbladder wall thickening, or biliary dilatation. Pancreas: Unremarkable. No pancreatic ductal dilatation or surrounding inflammatory changes. Spleen: Normal in size without focal abnormality. Adrenals/Urinary Tract: Normal adrenal glands. No renal obstruction or hydronephrosis. No hydroureter or obstructing ureteral calculus. Bladder unremarkable. Posterior midpole right renal hypodense cyst measures 10 mm, image 39 series 2. Stomach/Bowel: In the left abdomen, there are several dilated loops of jejunum which are fluid filled. Minor strandy adjacent small bowel mesenteric edema. One of the loops half fecalization compatible with slow transit. More distally, the ileum and colon are relatively collapsed. Findings compatible with developing mid jejunal small-bowel obstruction. These dilated loops are slightly angulated but no clear etiology of obstruction identified. No pneumatosis or free air.  No fluid collection or abscess. Vascular/Lymphatic: Aortic atherosclerosis noted. Negative for aneurysm. Mesenteric and renal vasculature appear patent. Mildly prominent central mesenteric lymph nodes with surrounding mesenteric haziness compatible with nonspecific mesenteric adenitis. Reproductive: Prostate normal in size. No significant finding by CT. Seminal vesicles symmetric in size. Other: No abdominal wall hernia or abnormality. No abdominopelvic ascites. Musculoskeletal: Degenerative changes noted spine. No  acute osseous finding. IMPRESSION: Left abdominal dilated loops of the jejunum with air-fluid levels, fecalization, and  minor adjacent small bowel mesenteric edema. More distal small bowel and colon are collapsed. Findings compatible with developing mid jejunal small bowel obstruction. No pneumatosis or free air. Negative for abscess Nonspecific central mesenteric lymphadenitis. Atherosclerosis Electronically Signed   By: Jerilynn Mages.  Shick M.D.   On: 09/16/2017 12:33   Dg Abd Acute W/chest  Result Date: 09/16/2017 CLINICAL DATA:  Epigastric pain for 2 days, history of MI 06/22/2017 EXAM: DG ABDOMEN ACUTE W/ 1V CHEST COMPARISON:  09/05/2013 FINDINGS: Normal heart size, mediastinal contours, and pulmonary vascularity. Minimal bronchitic changes. No acute infiltrate, pleural effusion or pneumothorax. Bones unremarkable. Nonobstructive bowel gas pattern. Few nonspecific minimally prominent small bowel loops in the LEFT upper quadrant. Scattered stool in colon. No bowel wall thickening, bowel dilatation, or free intraperitoneal air. No urinary tract calcification. IMPRESSION: Bronchitic changes without infiltrate. Nonobstructive bowel gas pattern with a few minimally prominent small bowel loops in the LEFT upper quadrant, nonspecific, could reflect mild ileus. Electronically Signed   By: Lavonia Dana M.D.   On: 09/16/2017 10:56    Assessment & Plan:  AENGUS SAUCEDA is a 57 y.o. male with thickened small bowel and fecalization consistent with possible focal ileus/ partial small bowel obstruction of unknown etiology. He has only ever had a right inguinal hernia repair and no other abdominal surgeries.  The CT is poor quality and difficult to follow the bowel. Differential could be Crohn's, viral enteritis / bacterial enteritis/ cancer.  Labs reassuring.   -Will review CT with radiologist in the AM to further look at images and see if they can piece together better  -NPO, does not need NG tube as this is distal and he is  having BMs and is not clinically obstructed  -IVF  -Continue cardiac medications as prescribed   All questions were answered to the satisfaction of the patient and family.  [name] 09/16/2017, 5:25 PM

## 2017-09-16 NOTE — ED Provider Notes (Signed)
[facility] EMERGENCY DEPARTMENT Provider Note   CSN: [id number] Arrival date & time: 09/16/17  3614     History   Chief Complaint Chief Complaint  Patient presents with  . Chest Pain    HPI Daniel Mcgee is a 57 y.o. male with a history of hyperlipidemia with an acute stemi 2/19 with stent x 1 presenting with intermittent episodes of epigastric pain sometimes with radiation into chest, but now currently radiating into his lower back.  His pain is dull at times, but sometimes has sharp stabs of pain. He endorses   There are no recognized triggers, meals, activity or exertion.  He does endorse having increased bowel movements over the past several days, had 3 firmer than normal stools yesterday, another this am but does not currently feel constipated.  He denies palpitations, n/v/ sob, diaphoresis.  He has tried antacids without relief and took 2 ntg tablets prior to arrival today which did not improve his symptoms.  The history is provided by the patient and the spouse.    Past Medical History:  Diagnosis Date  . Family history of premature CAD   . HLD (hyperlipidemia)   . STEMI (ST elevation myocardial infarction) ([place])    2/19 PCI/DES x1 to Lcx, mild disease in RCA, LAD, normal EF    Patient Active Problem List   Diagnosis Date Noted  . Hyperlipidemia 06/24/2017  . STEMI involving left circumflex coronary artery ([place]) 06/22/2017  . ST elevation myocardial infarction (STEMI) of inferior wall ([place]) 06/22/2017    Past Surgical History:  Procedure Laterality Date  . CORONARY STENT PLACEMENT    . CORONARY/GRAFT ACUTE MI REVASCULARIZATION N/A 06/22/2017   Procedure: Coronary/Graft Acute MI Revascularization;  Surgeon: Sherren Mocha, MD;  Location: [place] [facility];  Service: Cardiovascular;  Laterality: N/A;  . INGUINAL HERNIA REPAIR Right 2016  . LEFT HEART CATH AND CORONARY ANGIOGRAPHY N/A 06/22/2017   Procedure: LEFT HEART CATH AND CORONARY ANGIOGRAPHY;  Surgeon:  Sherren Mocha, MD;  Location: [place] [facility];  Service: Cardiovascular;  Laterality: N/A;        Home Medications    Prior to Admission medications   Medication Sig Start Date End Date Taking? Authorizing Provider  aspirin EC 81 MG tablet Take 81 mg by mouth daily.   Yes [provider]  aspirin-sod bicarb-citric acid (ALKA-SELTZER) 325 MG TBEF tablet Take 325 mg by mouth every 6 (six) hours as needed (For indigestion).   Yes [provider]  atorvastatin (LIPITOR) 80 MG tablet Take 1 tablet (80 mg total) by mouth daily at 6 PM. 06/24/17  Yes Cheryln Manly, NP  bismuth subsalicylate (PEPTO BISMOL) 262 MG/15ML suspension Take 30 mLs by mouth every 6 (six) hours as needed for indigestion.   Yes [provider]  metoprolol tartrate (LOPRESSOR) 25 MG tablet Take 0.5 tablets (12.5 mg total) by mouth 2 (two) times daily. 07/28/17  Yes Herminio Commons, MD  nitroGLYCERIN (NITROSTAT) 0.4 MG SL tablet Place 1 tablet (0.4 mg total) under the tongue every 5 (five) minutes x 3 doses as needed for chest pain. 06/24/17  Yes Cheryln Manly, NP  ranitidine (ZANTAC) 150 MG tablet Take 150 mg by mouth daily.   Yes [provider]  ticagrelor (BRILINTA) 90 MG TABS tablet Take 1 tablet (90 mg total) by mouth 2 (two) times daily. 06/24/17  Yes Cheryln Manly, NP    Family History Family History  Problem Relation Age of Onset  . CAD  Brother 6  . CAD Brother   . Heart attack Mother     Social History Social History   Tobacco Use  . Smoking status: Never Smoker  . Smokeless tobacco: Former Systems developer    Types: Chew  Substance Use Topics  . Alcohol use: Yes    Alcohol/week: 0.6 oz    Types: 1 Cans of beer per week    Comment: occasionally   . Drug use: No     Allergies   Hydrocodone   Review of Systems Review of Systems  Constitutional: Negative for appetite change, diaphoresis and fever.  HENT: Negative for congestion and sore throat.     Eyes: Negative.   Respiratory: Negative for chest tightness and shortness of breath.   Cardiovascular: Positive for chest pain. Negative for palpitations and leg swelling.  Gastrointestinal: Positive for abdominal pain and constipation. Negative for nausea, rectal pain and vomiting.  Genitourinary: Negative.   Musculoskeletal: Positive for back pain. Negative for arthralgias, joint swelling and neck pain.  Skin: Negative.  Negative for rash and wound.  Neurological: Negative for dizziness, weakness, light-headedness, numbness and headaches.  Psychiatric/Behavioral: Negative.      Physical Exam Updated Vital Signs BP 108/65 (BP Location: Right Arm)   Pulse (!) 52   Temp 98.1 F (36.7 C) (Oral)   Resp 12   Ht 6\' 3"  (1.905 m)   Wt 90.7 kg (200 lb)   SpO2 97%   BMI 25.00 kg/m   Physical Exam  Constitutional: He appears well-developed and well-nourished.  HENT:  Head: Normocephalic and atraumatic.  Eyes: Conjunctivae are normal.  Neck: Normal range of motion.  Cardiovascular: Normal rate, regular rhythm, normal heart sounds and intact distal pulses.  Pulmonary/Chest: Effort normal and breath sounds normal. He has no wheezes.  Abdominal: Soft. Bowel sounds are increased. There is no hepatosplenomegaly. There is tenderness in the epigastric area and left upper quadrant. There is no rigidity and no guarding.  Musculoskeletal: Normal range of motion.  Neurological: He is alert.  Skin: Skin is warm and dry.  Psychiatric: He has a normal mood and affect.  Nursing note and vitals reviewed.    ED Treatments / Results  Labs (all labs ordered are listed, but only abnormal results are displayed) Labs Reviewed  COMPREHENSIVE METABOLIC PANEL - Abnormal; Notable for the following components:      Result Value   Glucose, Bld 122 (*)    AST 45 (*)    Total Bilirubin 1.3 (*)    All other components within normal limits  LIPASE, BLOOD  CBC WITH DIFFERENTIAL/PLATELET  I-STAT TROPONIN,  ED    EKG None  Radiology Ct Abdomen Pelvis W Contrast  Result Date: 09/16/2017 CLINICAL DATA:  Epigastric abdominal pain EXAM: CT ABDOMEN AND PELVIS WITH CONTRAST TECHNIQUE: Multidetector CT imaging of the abdomen and pelvis was performed using the standard protocol following bolus administration of intravenous contrast. CONTRAST:  19mL ISOVUE-300 IOPAMIDOL (ISOVUE-300) INJECTION 61% COMPARISON:  None. FINDINGS: Lower chest: Minor dependent basilar atelectasis. No lower lobe pneumonia. No pericardial effusion. Native coronary atherosclerosis noted. Hepatobiliary: No focal liver abnormality is seen. No gallstones, gallbladder wall thickening, or biliary dilatation. Pancreas: Unremarkable. No pancreatic ductal dilatation or surrounding inflammatory changes. Spleen: Normal in size without focal abnormality. Adrenals/Urinary Tract: Normal adrenal glands. No renal obstruction or hydronephrosis. No hydroureter or obstructing ureteral calculus. Bladder unremarkable. Posterior midpole right renal hypodense cyst measures 10 mm, image 39 series 2. Stomach/Bowel: In the left abdomen, there are several dilated loops of  jejunum which are fluid filled. Minor strandy adjacent small bowel mesenteric edema. One of the loops half fecalization compatible with slow transit. More distally, the ileum and colon are relatively collapsed. Findings compatible with developing mid jejunal small-bowel obstruction. These dilated loops are slightly angulated but no clear etiology of obstruction identified. No pneumatosis or free air.  No fluid collection or abscess. Vascular/Lymphatic: Aortic atherosclerosis noted. Negative for aneurysm. Mesenteric and renal vasculature appear patent. Mildly prominent central mesenteric lymph nodes with surrounding mesenteric haziness compatible with nonspecific mesenteric adenitis. Reproductive: Prostate normal in size. No significant finding by CT. Seminal vesicles symmetric in size. Other: No  abdominal wall hernia or abnormality. No abdominopelvic ascites. Musculoskeletal: Degenerative changes noted spine. No acute osseous finding. IMPRESSION: Left abdominal dilated loops of the jejunum with air-fluid levels, fecalization, and minor adjacent small bowel mesenteric edema. More distal small bowel and colon are collapsed. Findings compatible with developing mid jejunal small bowel obstruction. No pneumatosis or free air. Negative for abscess Nonspecific central mesenteric lymphadenitis. Atherosclerosis Electronically Signed   By: Jerilynn Mages.  Shick M.D.   On: 09/16/2017 12:33   Dg Abd Acute W/chest  Result Date: 09/16/2017 CLINICAL DATA:  Epigastric pain for 2 days, history of MI 06/22/2017 EXAM: DG ABDOMEN ACUTE W/ 1V CHEST COMPARISON:  09/05/2013 FINDINGS: Normal heart size, mediastinal contours, and pulmonary vascularity. Minimal bronchitic changes. No acute infiltrate, pleural effusion or pneumothorax. Bones unremarkable. Nonobstructive bowel gas pattern. Few nonspecific minimally prominent small bowel loops in the LEFT upper quadrant. Scattered stool in colon. No bowel wall thickening, bowel dilatation, or free intraperitoneal air. No urinary tract calcification. IMPRESSION: Bronchitic changes without infiltrate. Nonobstructive bowel gas pattern with a few minimally prominent small bowel loops in the LEFT upper quadrant, nonspecific, could reflect mild ileus. Electronically Signed   By: Lavonia Dana M.D.   On: 09/16/2017 10:56    Procedures Procedures (including critical care time)  Medications Ordered in ED Medications  aspirin chewable tablet 243 mg (243 mg Oral Given 09/16/17 0943)  morphine 4 MG/ML injection 4 mg (4 mg Intravenous Given 09/16/17 0943)  ondansetron (ZOFRAN) injection 4 mg (4 mg Intravenous Given 09/16/17 0943)  famotidine (PEPCID) IVPB 20 mg premix (0 mg Intravenous Stopped 09/16/17 1100)  iopamidol (ISOVUE-300) 61 % injection 100 mL (100 mLs Intravenous Contrast Given 09/16/17 1117)      Initial Impression / Assessment and Plan / ED Course  I have reviewed the triage vital signs and the nursing notes.  Pertinent labs & imaging results that were available during my care of the patient were reviewed by me and considered in my medical decision making (see chart for details).     Discussed results with Dr. Constance Haw who will consult patient, asks for hospitalist admission.  Call placed to Dr. Carles Collet with the hospitalist group who will see patient in the ED.  Results and plan were also discussed with the patient and his wife and were agreeable with admission.  Final Clinical Impressions(s) / ED Diagnoses   Final diagnoses:  Small bowel obstruction [facility])    ED Discharge Orders    None       Landis Martins 09/16/17 1412    Milton Ferguson, MD 09/16/17 1510

## 2017-09-16 NOTE — H&P (Signed)
History and Physical  Daniel Mcgee KGM:[id number] DOB: 1960/08/06 DOA: 09/16/2017   PCP: Caryl Bis, MD   Patient coming from: Home  Chief Complaint: abdominal pain  HPI:  Daniel Mcgee is a 57 y.o. male with medical history of coronary artery disease with STEMI in February 2019, hyperlipidemia presenting with intermittent epigastric abdominal pain that began on 09/13/2017.  The patient states that the pain is sharp in nature lasting few seconds to 1 minute.  He cannot relate any exacerbating or alleviating factors.  He states that it is not worsened by food.  He tried taking some Pepto-Bismol and Alka-Seltzer without any relief.  He denied any nausea, vomiting, diarrhea, hematochezia, melena, fevers, chills.  He denies any recent viral type illnesses.  He denies any dysuria, hematuria.  He states that his last bowel movement was in the morning of 09/16/2017.  He states that his only surgery is right inguinal herniorrhaphy.  He denies any new medications since his discharge from the hospital in February 2019 from his STEMI.  Because of increasing frequency of his upper abdominal pain, the patient presented emergency department for further evaluation.  He states that this discomfort is different than the discomfort he had when he had his MI.  He denies any dizziness, shortness breath, coughing, hemoptysis.  In the emergency department, the patient was afebrile hemodynamically stable saturating 97% on room air.  BMP, CBC, and acute abdominal series were unremarkable.  CT of the abdomen and pelvis showed several dilated loops of fluid-filled jejunum with equalization.  There was minor stranding adjacent to the small bowel with collapsed ileum and colon consistent with jejunal SBO.  There is prominent central mesenteric lymph nodes consistent with mesenteric adenitis.  Assessment/Plan: Small bowel obstruction -Etiology unclear -Patient has only had right inguinal herniorrhaphy -npo except  for sips with medications -IV fluids -General surgery consultation -Judicious IV opioids  Coronary artery disease -Status post STEMI February 2019 with DES to left circumflex -Continue aspirin and Brilinta -Continue metoprolol tartrate -Continue statin -EKG without concerning ischemic changes  Diabetes mellitus type 2 -Currently being managed with diet -06/22/2017 hemoglobin A1c 6.7 -Repeat hemoglobin A1c       Past Medical History:  Diagnosis Date  . Family history of premature CAD   . HLD (hyperlipidemia)   . STEMI (ST elevation myocardial infarction) ([place])    2/19 PCI/DES x1 to Lcx, mild disease in RCA, LAD, normal EF   Past Surgical History:  Procedure Laterality Date  . CORONARY STENT PLACEMENT    . CORONARY/GRAFT ACUTE MI REVASCULARIZATION N/A 06/22/2017   Procedure: Coronary/Graft Acute MI Revascularization;  Surgeon: Sherren Mocha, MD;  Location: [place] [facility];  Service: Cardiovascular;  Laterality: N/A;  . INGUINAL HERNIA REPAIR Right 2016  . LEFT HEART CATH AND CORONARY ANGIOGRAPHY N/A 06/22/2017   Procedure: LEFT HEART CATH AND CORONARY ANGIOGRAPHY;  Surgeon: Sherren Mocha, MD;  Location: [place] [facility];  Service: Cardiovascular;  Laterality: N/A;   Social History:  reports that he has never smoked. He quit smokeless tobacco use about 33 years ago. His smokeless tobacco use included chew. He reports that he drinks about 0.6 oz of alcohol per week. He reports that he does not use drugs.   Family History  Problem Relation Age of Onset  . CAD Brother 81  . CAD Brother   . Heart attack Mother      Allergies  Allergen Reactions  . Hydrocodone Itching  Prior to Admission medications   Medication Sig Start Date End Date Taking? Authorizing Provider  aspirin EC 81 MG tablet Take 81 mg by mouth daily.   Yes [provider]  aspirin-sod bicarb-citric acid (ALKA-SELTZER) 325 MG TBEF tablet Take 325 mg by mouth every 6 (six) hours as  needed (For indigestion).   Yes [provider]  atorvastatin (LIPITOR) 80 MG tablet Take 1 tablet (80 mg total) by mouth daily at 6 PM. 06/24/17  Yes Cheryln Manly, NP  bismuth subsalicylate (PEPTO BISMOL) 262 MG/15ML suspension Take 30 mLs by mouth every 6 (six) hours as needed for indigestion.   Yes [provider]  metoprolol tartrate (LOPRESSOR) 25 MG tablet Take 0.5 tablets (12.5 mg total) by mouth 2 (two) times daily. 07/28/17  Yes Herminio Commons, MD  nitroGLYCERIN (NITROSTAT) 0.4 MG SL tablet Place 1 tablet (0.4 mg total) under the tongue every 5 (five) minutes x 3 doses as needed for chest pain. 06/24/17  Yes Cheryln Manly, NP  ranitidine (ZANTAC) 150 MG tablet Take 150 mg by mouth daily.   Yes [provider]  ticagrelor (BRILINTA) 90 MG TABS tablet Take 1 tablet (90 mg total) by mouth 2 (two) times daily. 06/24/17  Yes Cheryln Manly, NP    Review of Systems:  Constitutional:  No weight loss, night sweats, Fevers, chills, fatigue.  Head&Eyes: No headache.  No vision loss.  No eye pain or scotoma ENT:  No Difficulty swallowing,Tooth/dental problems,Sore throat,  No ear ache, post nasal drip,  Cardio-vascular:  No chest pain, Orthopnea, PND, swelling in lower extremities,  dizziness, palpitations  GI:  No  nausea, vomiting, diarrhea, loss of appetite, hematochezia, melena, heartburn, indigestion, Resp:  No shortness of breath with exertion or at rest. No cough. No coughing up of blood .No wheezing.No chest wall deformity  Skin:  no rash or lesions.  GU:  no dysuria, change in color of urine, no urgency or frequency. No flank pain.  Musculoskeletal:  No joint pain or swelling. No decreased range of motion. No back pain.  Psych:  No change in mood or affect. No depression or anxiety. Neurologic: No headache, no dysesthesia, no focal weakness, no vision loss. No syncope  Physical Exam: Vitals:   09/16/17 0918 09/16/17 1022 09/16/17  1245 09/16/17 1436  BP: (!) 200/173 133/85 108/65 124/78  Pulse: (!) 53 (!) 58 (!) 52 (!) 52  Resp: 12 12 12 12   Temp: 98.1 F (36.7 C)     TempSrc: Oral     SpO2: 97% 96% 97% 98%  Weight:      Height:       General:  A&O x 3, NAD, nontoxic, pleasant/cooperative Head/Eye: No conjunctival hemorrhage, no icterus, [place]/AT, No nystagmus ENT:  No icterus,  No thrush, good dentition, no pharyngeal exudate Neck:  No masses, no lymphadenpathy, no bruits CV:  RRR, no rub, no gallop, no S3 Lung:  CTAB, good air movement, no wheeze, no rhonchi Abdomen: soft/epigastric abd pain, +BS, nondistended, no peritoneal signs Ext: No cyanosis, No rashes, No petechiae, No lymphangitis, No edema Neuro: CNII-XII intact, strength 4/5 in bilateral upper and lower extremities, no dysmetria  Labs on Admission:  Basic Metabolic Panel: Recent Labs  Lab 09/16/17 0944  NA 140  K 4.2  CL 104  CO2 24  GLUCOSE 122*  BUN 11  CREATININE 0.76  CALCIUM 9.7   Liver Function Tests: Recent Labs  Lab 09/16/17 0944  AST 45*  ALT 47  ALKPHOS 67  BILITOT 1.3*  PROT 7.7  ALBUMIN 4.6   Recent Labs  Lab 09/16/17 0944  LIPASE 28   No results for input(s): AMMONIA in the last 168 hours. CBC: Recent Labs  Lab 09/16/17 0958  WBC 8.1  NEUTROABS 5.6  HGB 15.8  HCT 47.0  MCV 89.7  PLT 203   Coagulation Profile: No results for input(s): INR, PROTIME in the last 168 hours. Cardiac Enzymes: No results for input(s): CKTOTAL, CKMB, CKMBINDEX, TROPONINI in the last 168 hours. BNP: Invalid input(s): POCBNP CBG: No results for input(s): GLUCAP in the last 168 hours. Urine analysis: No results found for: COLORURINE, APPEARANCEUR, LABSPEC, PHURINE, GLUCOSEU, HGBUR, BILIRUBINUR, KETONESUR, PROTEINUR, UROBILINOGEN, NITRITE, LEUKOCYTESUR Sepsis Labs: @LABRCNTIP (procalcitonin:4,lacticidven:4) )No results found for this or any previous visit (from the past 240 hour(s)).   Radiological Exams on Admission: Ct  Abdomen Pelvis W Contrast  Result Date: 09/16/2017 CLINICAL DATA:  Epigastric abdominal pain EXAM: CT ABDOMEN AND PELVIS WITH CONTRAST TECHNIQUE: Multidetector CT imaging of the abdomen and pelvis was performed using the standard protocol following bolus administration of intravenous contrast. CONTRAST:  182mL ISOVUE-300 IOPAMIDOL (ISOVUE-300) INJECTION 61% COMPARISON:  None. FINDINGS: Lower chest: Minor dependent basilar atelectasis. No lower lobe pneumonia. No pericardial effusion. Native coronary atherosclerosis noted. Hepatobiliary: No focal liver abnormality is seen. No gallstones, gallbladder wall thickening, or biliary dilatation. Pancreas: Unremarkable. No pancreatic ductal dilatation or surrounding inflammatory changes. Spleen: Normal in size without focal abnormality. Adrenals/Urinary Tract: Normal adrenal glands. No renal obstruction or hydronephrosis. No hydroureter or obstructing ureteral calculus. Bladder unremarkable. Posterior midpole right renal hypodense cyst measures 10 mm, image 39 series 2. Stomach/Bowel: In the left abdomen, there are several dilated loops of jejunum which are fluid filled. Minor strandy adjacent small bowel mesenteric edema. One of the loops half fecalization compatible with slow transit. More distally, the ileum and colon are relatively collapsed. Findings compatible with developing mid jejunal small-bowel obstruction. These dilated loops are slightly angulated but no clear etiology of obstruction identified. No pneumatosis or free air.  No fluid collection or abscess. Vascular/Lymphatic: Aortic atherosclerosis noted. Negative for aneurysm. Mesenteric and renal vasculature appear patent. Mildly prominent central mesenteric lymph nodes with surrounding mesenteric haziness compatible with nonspecific mesenteric adenitis. Reproductive: Prostate normal in size. No significant finding by CT. Seminal vesicles symmetric in size. Other: No abdominal wall hernia or abnormality. No  abdominopelvic ascites. Musculoskeletal: Degenerative changes noted spine. No acute osseous finding. IMPRESSION: Left abdominal dilated loops of the jejunum with air-fluid levels, fecalization, and minor adjacent small bowel mesenteric edema. More distal small bowel and colon are collapsed. Findings compatible with developing mid jejunal small bowel obstruction. No pneumatosis or free air. Negative for abscess Nonspecific central mesenteric lymphadenitis. Atherosclerosis Electronically Signed   By: Jerilynn Mages.  Shick M.D.   On: 09/16/2017 12:33   Dg Abd Acute W/chest  Result Date: 09/16/2017 CLINICAL DATA:  Epigastric pain for 2 days, history of MI 06/22/2017 EXAM: DG ABDOMEN ACUTE W/ 1V CHEST COMPARISON:  09/05/2013 FINDINGS: Normal heart size, mediastinal contours, and pulmonary vascularity. Minimal bronchitic changes. No acute infiltrate, pleural effusion or pneumothorax. Bones unremarkable. Nonobstructive bowel gas pattern. Few nonspecific minimally prominent small bowel loops in the LEFT upper quadrant. Scattered stool in colon. No bowel wall thickening, bowel dilatation, or free intraperitoneal air. No urinary tract calcification. IMPRESSION: Bronchitic changes without infiltrate. Nonobstructive bowel gas pattern with a few minimally prominent small bowel loops in the LEFT upper quadrant, nonspecific, could reflect mild ileus. Electronically Signed   By:  Lavonia Dana M.D.   On: 09/16/2017 10:56    EKG: Independently reviewed.  Sinus bradycardia, no ST-T wave change    Time spent:60 minutes Code Status:   FULL Family Communication:  No Family at bedside Disposition Plan: expect 2-3 day hospitalization Consults called: general surgery  DVT Prophylaxis: [place] Lovenox  Orson Eva, DO  [facility] Pager [phone]  If 7PM-7AM, please contact night-coverage [email or web address] Password [email or web address] 09/16/2017, 2:40 PM

## 2017-09-16 NOTE — ED Notes (Signed)
Patient transported to X-ray 

## 2017-09-16 NOTE — ED Triage Notes (Addendum)
Pt c/o epigastric pain that radiates up into the mid chest and into the back since Monday night. Pt denies SOB, nausea, vomiting, dizziness, lightheadedness. Pt has taken 2 Nitro at home with no relief.

## 2017-09-17 ENCOUNTER — Encounter (HOSPITAL_COMMUNITY): Payer: PRIVATE HEALTH INSURANCE

## 2017-09-17 DIAGNOSIS — A084 Viral intestinal infection, unspecified: Secondary | ICD-10-CM

## 2017-09-17 LAB — BASIC METABOLIC PANEL
Anion gap: 9 (ref 5–15)
BUN: 11 mg/dL (ref 6–20)
CHLORIDE: 103 mmol/L (ref 101–111)
CO2: 28 mmol/L (ref 22–32)
CREATININE: 0.93 mg/dL (ref 0.61–1.24)
Calcium: 9.3 mg/dL (ref 8.9–10.3)
GFR calc Af Amer: >60 mL/min (ref >60)
GFR calc non Af Amer: >60 mL/min (ref >60)
GLUCOSE: 104 mg/dL — AB (ref 65–99)
POTASSIUM: 4 mmol/L (ref 3.5–5.1)
SODIUM: 140 mmol/L (ref 135–145)

## 2017-09-17 LAB — CBC
HCT: 43.8 % (ref 39.0–52.0)
Hemoglobin: 14.3 g/dL (ref 13.0–17.0)
MCH: 29.3 pg (ref 26.0–34.0)
MCHC: 32.6 g/dL (ref 30.0–36.0)
MCV: 89.8 fL (ref 78.0–100.0)
PLATELETS: 189 10*3/uL (ref 150–400)
RBC: 4.88 MIL/uL (ref 4.22–5.81)
RDW: 13.2 % (ref 11.5–15.5)
WBC: 6.8 10*3/uL (ref 4.0–10.5)

## 2017-09-17 NOTE — Discharge Instructions (Signed)
Mesenteric Adenitis, Adult Mesenteric adenitis is inflammation of lymph nodes in the membrane that attaches the intestines to the inside wall of the abdomen (mesentery). Lymph nodes are collections of tissue that filter bacteria, viruses, and waste from the bloodstream. They are located in several different areas of the body and are part of the body's disease-fighting system (immunesystem). Symptoms of this condition are often similar to inflammation of the appendix (appendicitis). Mesenteric adenitis is a painful condition, but it usually clears up either without treatment or with antibiotic medicine. What are the causes? A viral or bacterial infection is the most common cause of this condition. The infection usually starts somewhere else in the body. What are the signs or symptoms? Symptoms of this condition include:  Abdominal pain and tenderness.  Fever.  Nausea and vomiting.  Diarrhea.  How is this diagnosed? This condition may be diagnosed based on:  A physical exam and your medical history.  Blood tests.  Abdominal ultrasound.  Abdominal CT scan.  How is this treated? If your condition is caused by a virus, it usually goes away without treatment within 1-2 weeks. If the cause is a bacterial infection, you will be treated with antibiotic medicines. Your health care provider may recommend medicines to help relieve pain or fever. Follow these instructions at home:  Take over-the-counter and prescription medicines only as told by your health care provider.  Do not drive or use heavy machinery while taking prescription pain medicines.  If you were prescribed an antibiotic medicine, take it as told by your health care provider. Do not stop taking the antibiotic even if you start to feel better.  Get plenty of rest.  Drink enough fluid to keep your urine pale yellow.  Follow instructions from your health care provider about eating or drinking restrictions.  Keep all  follow-up visits as told by your health care provider. This is important. Contact a health care provider if:  You have a fever. Get help right away if:  Your pain becomes severe or does not go away.  You vomit repeatedly.  You have severe pain in the lower, right part of your abdomen. This may be appendicitis.  You have bright red or black, tarry stools. Summary  Mesenteric adenitis is inflammation of lymph nodes in the membrane that attaches the intestines to the inside wall of the abdomen (mesentery).  Mesenteric adenitis is a painful condition, but it usually clears up either without treatment or with antibiotic medicine.  A viral or bacterial infection is the most common cause of this condition. This information is not intended to replace advice given to you by your health care provider. Make sure you discuss any questions you have with your health care provider. Document Released: 08/10/2016 Document Revised: 08/10/2016 Document Reviewed: 08/10/2016 Elsevier Interactive Patient Education  2018 [employer]  

## 2017-09-17 NOTE — Progress Notes (Signed)
Pt had a large BM. Notified Dr. Constance Haw who says he is clear to go home if hospitalist has no need to keep him. Notified Dr. Carles Collet of this.  Patient will follow up with Dr. Constance Haw in 2 weeks.

## 2017-09-17 NOTE — Progress Notes (Signed)
[facility] Progress Note     Subjective: Doing better. No pain. Having flatus. No nausea. Reviewed CT with Daniel Mcgee, radiology, agrees likely enteritis/ Crohn's but favor enteritis. Probably viral given lack of WBC.  Overall improving.    Objective: Vital signs in last 24 hours: Temp:  [97.8 F (36.6 C)-98.9 F (37.2 C)] 98.5 F (36.9 C) (05/10 0511) Pulse Rate:  [47-66] 61 (05/10 0511) Resp:  [11-18] 16 (05/09 1531) BP: (108-145)/(65-90) 128/79 (05/10 0511) SpO2:  [93 %-100 %] 97 % (05/10 0511) Last BM Date: 09/16/17  Intake/Output from previous day: 05/09 0701 - 05/10 0700 In: 1043.8 [I.V.:1043.8] Out: -  Intake/Output this shift: Total I/O In: 240 [P.O.:240] Out: -   General appearance: alert, cooperative and no distress Resp: normal work breathing GI: soft, non-tender; bowel sounds normal; no masses,  no organomegaly Extremities: extremities normal, atraumatic, no cyanosis or edema  Lab Results:  Recent Labs    09/16/17 0958 09/17/17 0436  WBC 8.1 6.8  HGB 15.8 14.3  HCT 47.0 43.8  PLT 203 189   BMET Recent Labs    09/16/17 0944 09/17/17 0436  NA 140 140  K 4.2 4.0  CL 104 103  CO2 24 28  GLUCOSE 122* 104*  BUN 11 11  CREATININE 0.76 0.93  CALCIUM 9.7 9.3    Reviewed with radiology see above  Studies/Results: Ct Abdomen Pelvis W Contrast  Result Date: 09/16/2017 CLINICAL DATA:  Epigastric abdominal pain EXAM: CT ABDOMEN AND PELVIS WITH CONTRAST TECHNIQUE: Multidetector CT imaging of the abdomen and pelvis was performed using the standard protocol following bolus administration of intravenous contrast. CONTRAST:  110mL ISOVUE-300 IOPAMIDOL (ISOVUE-300) INJECTION 61% COMPARISON:  None. FINDINGS: Lower chest: Minor dependent basilar atelectasis. No lower lobe pneumonia. No pericardial effusion. Native coronary atherosclerosis noted. Hepatobiliary: No focal liver abnormality is seen. No gallstones, gallbladder wall thickening, or  biliary dilatation. Pancreas: Unremarkable. No pancreatic ductal dilatation or surrounding inflammatory changes. Spleen: Normal in size without focal abnormality. Adrenals/Urinary Tract: Normal adrenal glands. No renal obstruction or hydronephrosis. No hydroureter or obstructing ureteral calculus. Bladder unremarkable. Posterior midpole right renal hypodense cyst measures 10 mm, image 39 series 2. Stomach/Bowel: In the left abdomen, there are several dilated loops of jejunum which are fluid filled. Minor strandy adjacent small bowel mesenteric edema. One of the loops half fecalization compatible with slow transit. More distally, the ileum and colon are relatively collapsed. Findings compatible with developing mid jejunal small-bowel obstruction. These dilated loops are slightly angulated but no clear etiology of obstruction identified. No pneumatosis or free air.  No fluid collection or abscess. Vascular/Lymphatic: Aortic atherosclerosis noted. Negative for aneurysm. Mesenteric and renal vasculature appear patent. Mildly prominent central mesenteric lymph nodes with surrounding mesenteric haziness compatible with nonspecific mesenteric adenitis. Reproductive: Prostate normal in size. No significant finding by CT. Seminal vesicles symmetric in size. Other: No abdominal wall hernia or abnormality. No abdominopelvic ascites. Musculoskeletal: Degenerative changes noted spine. No acute osseous finding. IMPRESSION: Left abdominal dilated loops of the jejunum with air-fluid levels, fecalization, and minor adjacent small bowel mesenteric edema. More distal small bowel and colon are collapsed. Findings compatible with developing mid jejunal small bowel obstruction. No pneumatosis or free air. Negative for abscess Nonspecific central mesenteric lymphadenitis. Atherosclerosis Electronically Signed   By: Jerilynn Mages.  Shick M.D.   On: 09/16/2017 12:33   Dg Abd Acute W/chest  Result Date: 09/16/2017 CLINICAL DATA:  Epigastric pain for  2 days, history of MI 06/22/2017 EXAM: DG ABDOMEN ACUTE W/  1V CHEST COMPARISON:  09/05/2013 FINDINGS: Normal heart size, mediastinal contours, and pulmonary vascularity. Minimal bronchitic changes. No acute infiltrate, pleural effusion or pneumothorax. Bones unremarkable. Nonobstructive bowel gas pattern. Few nonspecific minimally prominent small bowel loops in the LEFT upper quadrant. Scattered stool in colon. No bowel wall thickening, bowel dilatation, or free intraperitoneal air. No urinary tract calcification. IMPRESSION: Bronchitic changes without infiltrate. Nonobstructive bowel gas pattern with a few minimally prominent small bowel loops in the LEFT upper quadrant, nonspecific, could reflect mild ileus. Electronically Signed   By: Lavonia Dana M.D.   On: 09/16/2017 10:56   Assessment/Plan: Daniel Mcgee is a 57 yo with a recent STEMI who presented with a pSBO from likely viral enteritis given the adenitis and the small bowel thickening. He is clinically not obstructed.  Have discussed differential including enteritis, Crohn's, mass/ cancer, and favor enteritis.   -Diet as tolerated -If has BM, can go home as long as no worsening pain -Will follow up in the office in 2 weeks and plan for CT enterography in 4 weeks (discussed with Daniel Mcgee who agrees with this imaging plan)   Updated Dr. Carles Collet.    LOS: 1 day    [name] 09/17/2017

## 2017-09-17 NOTE — Discharge Summary (Signed)
Physician Discharge Summary  Daniel Mcgee OQH:[id number] DOB: 10/29/1960 DOA: 09/16/2017  PCP: Caryl Bis, MD  Admit date: 09/16/2017 Discharge date: 09/17/2017  Admitted From: Home Disposition:  Home  Recommendations for Outpatient Follow-up:  1. Follow up with PCP in 1-2 weeks 2. Please obtain BMP/CBC in one week   Discharge Condition: Stable CODE STATUS:FULL Diet recommendation: Heart Healthy / Soft diet  Brief/Interim Summary: 57 y.o. male with medical history of coronary artery disease with STEMI in February 2019, hyperlipidemia presenting with intermittent epigastric abdominal pain that began on 09/13/2017.  The patient states that the pain is sharp in nature lasting few seconds to 1 minute.  He cannot relate any exacerbating or alleviating factors.  He states that it is not worsened by food.  He tried taking some Pepto-Bismol and Alka-Seltzer without any relief.  He denied any nausea, vomiting, diarrhea, hematochezia, melena, fevers, chills.  He denies any recent viral type illnesses.  He denies any dysuria, hematuria.  He states that his last bowel movement was in the morning of 09/16/2017.  He states that his only surgery is right inguinal herniorrhaphy.  He denies any new medications since his discharge from the hospital in February 2019 from his STEMI.  Because of increasing frequency of his upper abdominal pain, the patient presented emergency department for further evaluation.  CT of the abdomen and pelvis showed several dilated loops of fluid-filled jejunum with equalization.  There was minor stranding adjacent to the small bowel with collapsed ileum and colon consistent with jejunal SBO.  There is prominent central mesenteric lymph nodes consistent with mesenteric adenitis. The patient was placed on bowel rest and started on IV fluids.  Judicious pain control was administered.  General surgery was consulted.  It was felt that his obstruction may be due to some type of enteritis  although neoplasm or IBD cannot be completely ruled out.  The patient improved significantly over peer to 24 hours.  His clinical improvement was much quicker than anticipated.  The patient's diet was advanced which he tolerated.  The patient subsequently had a bowel movement in the late afternoon 09/17/2017.  Given his improvement, the patient was discharged in stable condition.  He will follow-up with general surgery, Dr. Constance Haw in 2 weeks.  Plan for CT enterography in 4 weeks.   Discharge Diagnoses:  Partial Small bowel obstruction -Etiology unclear, but suspect enteritis given mesenteric adenopathy -Patient has only had right inguinal herniorrhaphy -npo except for sips with medications>>significant, rapid improvement -diet advanced to soft diet over 24 hours which pt tolerated -had BM in afternoon 09/17/17 -case discussed with Dr. Jabier Gauss for d/c; CT enterography in 4 weeks -IV fluids -General surgery consultation appreciated -Judicious IV opioids  Coronary artery disease -Status post STEMI February 2019 with DES to left circumflex -Continue aspirin and Brilinta -Continue metoprolol tartrate -Continue statin -EKG without concerning ischemic changes  Diabetes mellitus type 2 -Currently being managed with diet -06/22/2017 hemoglobin A1c 6.7 -Repeat 09/16/17--hemoglobin A1c--5.7     Discharge Instructions   Allergies as of 09/17/2017      Reactions   Hydrocodone Itching      Medication List    TAKE these medications   aspirin EC 81 MG tablet Take 81 mg by mouth daily.   aspirin-sod bicarb-citric acid 325 MG Tbef tablet Commonly known as:  ALKA-SELTZER Take 325 mg by mouth every 6 (six) hours as needed (For indigestion).   atorvastatin 80 MG tablet Commonly known as:  LIPITOR Take 1 tablet (80  mg total) by mouth daily at 6 PM.   bismuth subsalicylate 124 PY/09XI suspension Commonly known as:  PEPTO BISMOL Take 30 mLs by mouth every 6 (six) hours as needed for  indigestion.   metoprolol tartrate 25 MG tablet Commonly known as:  LOPRESSOR Take 0.5 tablets (12.5 mg total) by mouth 2 (two) times daily.   nitroGLYCERIN 0.4 MG SL tablet Commonly known as:  NITROSTAT Place 1 tablet (0.4 mg total) under the tongue every 5 (five) minutes x 3 doses as needed for chest pain.   ranitidine 150 MG tablet Commonly known as:  ZANTAC Take 150 mg by mouth daily.   ticagrelor 90 MG Tabs tablet Commonly known as:  BRILINTA Take 1 tablet (90 mg total) by mouth 2 (two) times daily.      Follow-up Information    [name], MD Follow up in 2 week(s).   Specialty:  General Surgery Contact information: [address] [place] [facility] [address] [phone]          Allergies  Allergen Reactions  . Hydrocodone Itching    Consultations:  General surgery   Procedures/Studies: Ct Abdomen Pelvis W Contrast  Result Date: 09/16/2017 CLINICAL DATA:  Epigastric abdominal pain EXAM: CT ABDOMEN AND PELVIS WITH CONTRAST TECHNIQUE: Multidetector CT imaging of the abdomen and pelvis was performed using the standard protocol following bolus administration of intravenous contrast. CONTRAST:  145mL ISOVUE-300 IOPAMIDOL (ISOVUE-300) INJECTION 61% COMPARISON:  None. FINDINGS: Lower chest: Minor dependent basilar atelectasis. No lower lobe pneumonia. No pericardial effusion. Native coronary atherosclerosis noted. Hepatobiliary: No focal liver abnormality is seen. No gallstones, gallbladder wall thickening, or biliary dilatation. Pancreas: Unremarkable. No pancreatic ductal dilatation or surrounding inflammatory changes. Spleen: Normal in size without focal abnormality. Adrenals/Urinary Tract: Normal adrenal glands. No renal obstruction or hydronephrosis. No hydroureter or obstructing ureteral calculus. Bladder unremarkable. Posterior midpole right renal hypodense cyst measures 10 mm, image 39 series 2. Stomach/Bowel: In the left abdomen, there are several dilated  loops of jejunum which are fluid filled. Minor strandy adjacent small bowel mesenteric edema. One of the loops half fecalization compatible with slow transit. More distally, the ileum and colon are relatively collapsed. Findings compatible with developing mid jejunal small-bowel obstruction. These dilated loops are slightly angulated but no clear etiology of obstruction identified. No pneumatosis or free air.  No fluid collection or abscess. Vascular/Lymphatic: Aortic atherosclerosis noted. Negative for aneurysm. Mesenteric and renal vasculature appear patent. Mildly prominent central mesenteric lymph nodes with surrounding mesenteric haziness compatible with nonspecific mesenteric adenitis. Reproductive: Prostate normal in size. No significant finding by CT. Seminal vesicles symmetric in size. Other: No abdominal wall hernia or abnormality. No abdominopelvic ascites. Musculoskeletal: Degenerative changes noted spine. No acute osseous finding. IMPRESSION: Left abdominal dilated loops of the jejunum with air-fluid levels, fecalization, and minor adjacent small bowel mesenteric edema. More distal small bowel and colon are collapsed. Findings compatible with developing mid jejunal small bowel obstruction. No pneumatosis or free air. Negative for abscess Nonspecific central mesenteric lymphadenitis. Atherosclerosis Electronically Signed   By: Jerilynn Mages.  Shick M.D.   On: 09/16/2017 12:33   Dg Abd Acute W/chest  Result Date: 09/16/2017 CLINICAL DATA:  Epigastric pain for 2 days, history of MI 06/22/2017 EXAM: DG ABDOMEN ACUTE W/ 1V CHEST COMPARISON:  09/05/2013 FINDINGS: Normal heart size, mediastinal contours, and pulmonary vascularity. Minimal bronchitic changes. No acute infiltrate, pleural effusion or pneumothorax. Bones unremarkable. Nonobstructive bowel gas pattern. Few nonspecific minimally prominent small bowel loops in the LEFT upper quadrant. Scattered stool  in colon. No bowel wall thickening, bowel dilatation, or  free intraperitoneal air. No urinary tract calcification. IMPRESSION: Bronchitic changes without infiltrate. Nonobstructive bowel gas pattern with a few minimally prominent small bowel loops in the LEFT upper quadrant, nonspecific, could reflect mild ileus. Electronically Signed   By: Lavonia Dana M.D.   On: 09/16/2017 10:56         Discharge Exam: Vitals:   09/17/17 0511 09/17/17 1410  BP: 128/79 (P) 137/87  Pulse: 61 (!) (P) 57  Resp:  (P) 18  Temp: 98.5 F (36.9 C) (P) 97.9 F (36.6 C)  SpO2: 97% (P) 97%   Vitals:   09/16/17 2129 09/16/17 2354 09/17/17 0511 09/17/17 1410  BP: 130/83  128/79 (P) 137/87  Pulse: (!) 57 66 61 (!) (P) 57  Resp:    (P) 18  Temp: 98.9 F (37.2 C)  98.5 F (36.9 C) (P) 97.9 F (36.6 C)  TempSrc: Oral  Oral (P) Oral  SpO2: 98%  97% (P) 97%  Weight:      Height:        General: Pt is alert, awake, not in acute distress Cardiovascular: RRR, S1/S2 +, no rubs, no gallops Respiratory: CTA bilaterally, no wheezing, no rhonchi Abdominal: Soft, NT, ND, bowel sounds + Extremities: no edema, no cyanosis   The results of significant diagnostics from this hospitalization (including imaging, microbiology, ancillary and laboratory) are listed below for reference.    Significant Diagnostic Studies: Ct Abdomen Pelvis W Contrast  Result Date: 09/16/2017 CLINICAL DATA:  Epigastric abdominal pain EXAM: CT ABDOMEN AND PELVIS WITH CONTRAST TECHNIQUE: Multidetector CT imaging of the abdomen and pelvis was performed using the standard protocol following bolus administration of intravenous contrast. CONTRAST:  155mL ISOVUE-300 IOPAMIDOL (ISOVUE-300) INJECTION 61% COMPARISON:  None. FINDINGS: Lower chest: Minor dependent basilar atelectasis. No lower lobe pneumonia. No pericardial effusion. Native coronary atherosclerosis noted. Hepatobiliary: No focal liver abnormality is seen. No gallstones, gallbladder wall thickening, or biliary dilatation. Pancreas: Unremarkable.  No pancreatic ductal dilatation or surrounding inflammatory changes. Spleen: Normal in size without focal abnormality. Adrenals/Urinary Tract: Normal adrenal glands. No renal obstruction or hydronephrosis. No hydroureter or obstructing ureteral calculus. Bladder unremarkable. Posterior midpole right renal hypodense cyst measures 10 mm, image 39 series 2. Stomach/Bowel: In the left abdomen, there are several dilated loops of jejunum which are fluid filled. Minor strandy adjacent small bowel mesenteric edema. One of the loops half fecalization compatible with slow transit. More distally, the ileum and colon are relatively collapsed. Findings compatible with developing mid jejunal small-bowel obstruction. These dilated loops are slightly angulated but no clear etiology of obstruction identified. No pneumatosis or free air.  No fluid collection or abscess. Vascular/Lymphatic: Aortic atherosclerosis noted. Negative for aneurysm. Mesenteric and renal vasculature appear patent. Mildly prominent central mesenteric lymph nodes with surrounding mesenteric haziness compatible with nonspecific mesenteric adenitis. Reproductive: Prostate normal in size. No significant finding by CT. Seminal vesicles symmetric in size. Other: No abdominal wall hernia or abnormality. No abdominopelvic ascites. Musculoskeletal: Degenerative changes noted spine. No acute osseous finding. IMPRESSION: Left abdominal dilated loops of the jejunum with air-fluid levels, fecalization, and minor adjacent small bowel mesenteric edema. More distal small bowel and colon are collapsed. Findings compatible with developing mid jejunal small bowel obstruction. No pneumatosis or free air. Negative for abscess Nonspecific central mesenteric lymphadenitis. Atherosclerosis Electronically Signed   By: Jerilynn Mages.  Shick M.D.   On: 09/16/2017 12:33   Dg Abd Acute W/chest  Result Date: 09/16/2017 CLINICAL DATA:  Epigastric  pain for 2 days, history of MI 06/22/2017 EXAM: DG  ABDOMEN ACUTE W/ 1V CHEST COMPARISON:  09/05/2013 FINDINGS: Normal heart size, mediastinal contours, and pulmonary vascularity. Minimal bronchitic changes. No acute infiltrate, pleural effusion or pneumothorax. Bones unremarkable. Nonobstructive bowel gas pattern. Few nonspecific minimally prominent small bowel loops in the LEFT upper quadrant. Scattered stool in colon. No bowel wall thickening, bowel dilatation, or free intraperitoneal air. No urinary tract calcification. IMPRESSION: Bronchitic changes without infiltrate. Nonobstructive bowel gas pattern with a few minimally prominent small bowel loops in the LEFT upper quadrant, nonspecific, could reflect mild ileus. Electronically Signed   By: Lavonia Dana M.D.   On: 09/16/2017 10:56     Microbiology: No results found for this or any previous visit (from the past 240 hour(s)).   Labs: Basic Metabolic Panel: Recent Labs  Lab 09/16/17 0944 09/17/17 0436  NA 140 140  K 4.2 4.0  CL 104 103  CO2 24 28  GLUCOSE 122* 104*  BUN 11 11  CREATININE 0.76 0.93  CALCIUM 9.7 9.3   Liver Function Tests: Recent Labs  Lab 09/16/17 0944  AST 45*  ALT 47  ALKPHOS 67  BILITOT 1.3*  PROT 7.7  ALBUMIN 4.6   Recent Labs  Lab 09/16/17 0944  LIPASE 28   No results for input(s): AMMONIA in the last 168 hours. CBC: Recent Labs  Lab 09/16/17 0958 09/17/17 0436  WBC 8.1 6.8  NEUTROABS 5.6  --   HGB 15.8 14.3  HCT 47.0 43.8  MCV 89.7 89.8  PLT 203 189   Cardiac Enzymes: No results for input(s): CKTOTAL, CKMB, CKMBINDEX, TROPONINI in the last 168 hours. BNP: Invalid input(s): POCBNP CBG: No results for input(s): GLUCAP in the last 168 hours.  Time coordinating discharge:  36 minutes  Signed:  Orson Eva, DO [facility] Pager: [phone] 09/17/2017, 5:21 PM

## 2017-09-17 NOTE — Progress Notes (Signed)
IV removed and site intact. Patient discharged with all personal belongings and AVS as well.

## 2017-09-20 ENCOUNTER — Encounter (HOSPITAL_COMMUNITY)
Admission: RE | Admit: 2017-09-20 | Discharge: 2017-09-20 | Disposition: A | Discharge status: Home / Self Care | Payer: PRIVATE HEALTH INSURANCE | Source: Ambulatory Visit | Attending: Cardiovascular Disease | Admitting: Cardiovascular Disease

## 2017-09-20 DIAGNOSIS — Z79899 Other long term (current) drug therapy: Secondary | ICD-10-CM | POA: Diagnosis not present

## 2017-09-20 DIAGNOSIS — Z7982 Long term (current) use of aspirin: Secondary | ICD-10-CM | POA: Diagnosis not present

## 2017-09-20 DIAGNOSIS — Z955 Presence of coronary angioplasty implant and graft: Secondary | ICD-10-CM

## 2017-09-20 DIAGNOSIS — I2121 ST elevation (STEMI) myocardial infarction involving left circumflex coronary artery: Secondary | ICD-10-CM

## 2017-09-20 DIAGNOSIS — E785 Hyperlipidemia, unspecified: Secondary | ICD-10-CM | POA: Diagnosis not present

## 2017-09-20 DIAGNOSIS — Z8249 Family history of ischemic heart disease and other diseases of the circulatory system: Secondary | ICD-10-CM | POA: Diagnosis not present

## 2017-09-20 DIAGNOSIS — Z87891 Personal history of nicotine dependence: Secondary | ICD-10-CM | POA: Diagnosis not present

## 2017-09-20 DIAGNOSIS — I213 ST elevation (STEMI) myocardial infarction of unspecified site: Secondary | ICD-10-CM | POA: Diagnosis present

## 2017-09-20 DIAGNOSIS — Z7902 Long term (current) use of antithrombotics/antiplatelets: Secondary | ICD-10-CM | POA: Diagnosis not present

## 2017-09-20 NOTE — Progress Notes (Signed)
Daily Session Note  Patient Details  Name: Daniel Mcgee MRN: [id number] Date of Birth: 1960/10/27 Referring Provider:     CARDIAC REHAB PHASE II ORIENTATION from 07/22/2017 in [place]  Referring Provider  [name]       Encounter Date: 09/20/2017  Check In: Session Check In - 09/20/17 1141      Check-In   Location  [facility]    Staff Present  Diane Angelina Pih, MS, EP, [facility], Exercise Physiologist;Jora Galluzzo Luther Parody, BS, EP, Exercise Physiologist;Debra Wynetta Emery, RN, BSN    Supervising physician immediately available to respond to emergencies  See telemetry face sheet for immediately available MD    Medication changes reported      No    Fall or balance concerns reported     No    Warm-up and Cool-down  Performed as group-led instruction    Resistance Training Performed  Yes    VAD Patient?  No      Pain Assessment   Currently in Pain?  No/denies    Pain Score  0-No pain    Multiple Pain Sites  No       Capillary Blood Glucose: No results found for this or any previous visit (from the past 24 hour(s)).    Social History   Tobacco Use  Smoking Status Never Smoker  Smokeless Tobacco Former Systems developer  . Types: Chew    Goals Met:  Independence with exercise equipment Exercise tolerated well No report of cardiac concerns or symptoms Strength training completed today  Goals Unmet:  Not Applicable  Comments: Check out 1030   [name] is Medical Director for [place] and Pulmonary Rehab.

## 2017-09-22 ENCOUNTER — Encounter (HOSPITAL_COMMUNITY)
Admission: RE | Admit: 2017-09-22 | Discharge: 2017-09-22 | Disposition: A | Discharge status: Home / Self Care | Payer: PRIVATE HEALTH INSURANCE | Source: Ambulatory Visit | Attending: Cardiovascular Disease | Admitting: Cardiovascular Disease

## 2017-09-22 DIAGNOSIS — I213 ST elevation (STEMI) myocardial infarction of unspecified site: Secondary | ICD-10-CM | POA: Diagnosis not present

## 2017-09-22 DIAGNOSIS — Z955 Presence of coronary angioplasty implant and graft: Secondary | ICD-10-CM

## 2017-09-22 DIAGNOSIS — I2121 ST elevation (STEMI) myocardial infarction involving left circumflex coronary artery: Secondary | ICD-10-CM

## 2017-09-22 NOTE — Progress Notes (Signed)
Daily Session Note  Patient Details  Name: Daniel Mcgee MRN: [id number] Date of Birth: [birth year] Referring Provider:     CARDIAC REHAB PHASE II ORIENTATION from 07/22/2017 in [name]  Referring Provider  [name]       Encounter Date: 09/22/2017  Check In: Session Check In - 09/22/17 1115      Check-In   Location  [facility]    Staff Present  Suzanne Boron, BS, EP, Exercise Physiologist;Debra Wynetta Emery, RN, BSN    Supervising physician immediately available to respond to emergencies  See telemetry face sheet for immediately available MD    Medication changes reported      No    Fall or balance concerns reported     No    Warm-up and Cool-down  Performed as group-led instruction    Resistance Training Performed  Yes    VAD Patient?  No      Pain Assessment   Currently in Pain?  No/denies    Pain Score  0-No pain    Multiple Pain Sites  No       Capillary Blood Glucose: No results found for this or any previous visit (from the past 24 hour(s)).    Social History   Tobacco Use  Smoking Status Never Smoker  Smokeless Tobacco Former Systems developer  . Types: Chew    Goals Met:  Independence with exercise equipment Exercise tolerated well No report of cardiac concerns or symptoms Strength training completed today  Goals Unmet:  Not Applicable  Comments: Check out 1200   [name] is Medical Director for [place] and [facility] Re[facility].

## 2017-09-24 ENCOUNTER — Encounter (HOSPITAL_COMMUNITY)
Admission: RE | Admit: 2017-09-24 | Discharge: 2017-09-24 | Disposition: A | Discharge status: Home / Self Care | Payer: PRIVATE HEALTH INSURANCE | Source: Ambulatory Visit | Attending: Cardiovascular Disease | Admitting: Cardiovascular Disease

## 2017-09-24 DIAGNOSIS — Z955 Presence of coronary angioplasty implant and graft: Secondary | ICD-10-CM

## 2017-09-24 DIAGNOSIS — I213 ST elevation (STEMI) myocardial infarction of unspecified site: Secondary | ICD-10-CM | POA: Diagnosis not present

## 2017-09-24 DIAGNOSIS — I2121 ST elevation (STEMI) myocardial infarction involving left circumflex coronary artery: Secondary | ICD-10-CM

## 2017-09-24 NOTE — Progress Notes (Signed)
Daily Session Note  Patient Details  Name: SEVE MONETTE MRN: [id number] Date of Birth: [birth year] Referring Provider:     CARDIAC REHAB PHASE II ORIENTATION from 07/22/2017 in [place]  Referring Provider  [name]       Encounter Date: 09/24/2017  Check In: Session Check In - 09/24/17 1100      Check-In   Location  [facility]    Staff Present  Suzanne Boron, BS, EP, Exercise Physiologist;Ignatz Deis Wynetta Emery, RN, BSN    Supervising physician immediately available to respond to emergencies  See telemetry face sheet for immediately available MD    Medication changes reported      No    Fall or balance concerns reported     No    Warm-up and Cool-down  Performed as group-led instruction    Resistance Training Performed  Yes    VAD Patient?  No      Pain Assessment   Currently in Pain?  No/denies    Pain Score  0-No pain    Multiple Pain Sites  No       Capillary Blood Glucose: No results found for this or any previous visit (from the past 24 hour(s)).    Social History   Tobacco Use  Smoking Status Never Smoker  Smokeless Tobacco Former Systems developer  . Types: Chew    Goals Met:  Independence with exercise equipment Exercise tolerated well No report of cardiac concerns or symptoms Strength training completed today  Goals Unmet:  Not Applicable  Comments: Check out 1200.   [name] is Medical Director for [facility] and [facility].

## 2017-09-27 ENCOUNTER — Encounter (HOSPITAL_COMMUNITY)
Admission: RE | Admit: 2017-09-27 | Discharge: 2017-09-27 | Disposition: A | Discharge status: Home / Self Care | Payer: PRIVATE HEALTH INSURANCE | Source: Ambulatory Visit | Attending: Cardiovascular Disease | Admitting: Cardiovascular Disease

## 2017-09-27 DIAGNOSIS — I213 ST elevation (STEMI) myocardial infarction of unspecified site: Secondary | ICD-10-CM | POA: Diagnosis not present

## 2017-09-27 DIAGNOSIS — Z955 Presence of coronary angioplasty implant and graft: Secondary | ICD-10-CM

## 2017-09-27 DIAGNOSIS — I2121 ST elevation (STEMI) myocardial infarction involving left circumflex coronary artery: Secondary | ICD-10-CM

## 2017-09-27 NOTE — Progress Notes (Signed)
Daily Session Note  Patient Details  Name: Daniel Mcgee MRN: [id number] Date of Birth: April 09, 1961 Referring Provider:     CARDIAC REHAB PHASE II ORIENTATION from 07/22/2017 in [place]  Referring Provider  [name]       Encounter Date: 09/27/2017  Check In: Session Check In - 09/27/17 1100      Check-In   Location  [facility]    Staff Present  Suzanne Boron, BS, EP, Exercise Physiologist;Pearson Reasons Wynetta Emery, RN, BSN;Diane Coad, MS, EP, [facility], Exercise Physiologist    Supervising physician immediately available to respond to emergencies  See telemetry face sheet for immediately available MD    Medication changes reported      No    Fall or balance concerns reported     No    Warm-up and Cool-down  Performed as group-led instruction    Resistance Training Performed  Yes    VAD Patient?  No      Pain Assessment   Currently in Pain?  No/denies    Pain Score  0-No pain    Multiple Pain Sites  No       Capillary Blood Glucose: No results found for this or any previous visit (from the past 24 hour(s)).    Social History   Tobacco Use  Smoking Status Never Smoker  Smokeless Tobacco Former Systems developer  . Types: Chew    Goals Met:  Independence with exercise equipment Exercise tolerated well No report of cardiac concerns or symptoms Strength training completed today  Goals Unmet:  Not Applicable  Comments: Check out 1200.   [name] is Medical Director for [facility] and Pulmonary Rehab.

## 2017-09-28 ENCOUNTER — Ambulatory Visit (INDEPENDENT_AMBULATORY_CARE_PROVIDER_SITE_OTHER): Payer: PRIVATE HEALTH INSURANCE | Admitting: General Surgery

## 2017-09-28 ENCOUNTER — Encounter: Payer: Self-pay | Admitting: General Surgery

## 2017-09-28 VITALS — BP 121/72 | HR 52 | Temp 97.4°F | Resp 16 | Wt 201.0 lb

## 2017-09-28 DIAGNOSIS — K639 Disease of intestine, unspecified: Secondary | ICD-10-CM

## 2017-09-28 DIAGNOSIS — K56600 Partial intestinal obstruction, unspecified as to cause: Secondary | ICD-10-CM

## 2017-09-28 NOTE — Patient Instructions (Addendum)
CT scan to be scheduled in the upcoming weeks on or after 10/14/2017. Will call with results.

## 2017-09-28 NOTE — Progress Notes (Signed)
Cardiac Individual Treatment Plan  Patient Details  Name: Daniel Mcgee MRN: [id number] Date of Birth: November 12, 1960 Referring Provider:     CARDIAC REHAB PHASE II ORIENTATION from 07/22/2017 in [place]  Referring Provider  [name]       Initial Encounter Date:    CARDIAC REHAB PHASE II ORIENTATION from 07/22/2017 in [place]  Date  07/22/17  Referring Provider  [name]       Visit Diagnosis: ST elevation myocardial infarction involving left circumflex coronary artery [facility])  Status post coronary artery stent placement  Patient's Home Medications on Admission:  Current Outpatient Medications:  .  aspirin EC 81 MG tablet, Take 81 mg by mouth daily., Disp: , Rfl:  .  aspirin-sod bicarb-citric acid (ALKA-SELTZER) 325 MG TBEF tablet, Take 325 mg by mouth every 6 (six) hours as needed (For indigestion)., Disp: , Rfl:  .  atorvastatin (LIPITOR) 80 MG tablet, Take 1 tablet (80 mg total) by mouth daily at 6 PM., Disp: 90 tablet, Rfl: 1 .  bismuth subsalicylate (PEPTO BISMOL) 262 MG/15ML suspension, Take 30 mLs by mouth every 6 (six) hours as needed for indigestion., Disp: , Rfl:  .  metoprolol tartrate (LOPRESSOR) 25 MG tablet, Take 0.5 tablets (12.5 mg total) by mouth 2 (two) times daily., Disp: 90 tablet, Rfl: 1 .  nitroGLYCERIN (NITROSTAT) 0.4 MG SL tablet, Place 1 tablet (0.4 mg total) under the tongue every 5 (five) minutes x 3 doses as needed for chest pain., Disp: 25 tablet, Rfl: 2 .  ranitidine (ZANTAC) 150 MG tablet, Take 150 mg by mouth daily., Disp: , Rfl:  .  ticagrelor (BRILINTA) 90 MG TABS tablet, Take 1 tablet (90 mg total) by mouth 2 (two) times daily., Disp: 180 tablet, Rfl: 2  Past Medical History: Past Medical History:  Diagnosis Date  . Family history of premature CAD   . HLD (hyperlipidemia)   . STEMI (ST elevation myocardial infarction) ([place])    2/19 PCI/DES x1 to Lcx, mild disease in RCA, LAD, normal EF     Tobacco Use: Social History   Tobacco Use  Smoking Status Never Smoker  Smokeless Tobacco Former Systems developer  . Types: Chew    Labs: Recent Review Flowsheet Data    Labs for ITP Cardiac and Pulmonary Rehab Latest Ref Rng & Units 06/22/2017 06/23/2017 09/16/2017   Cholestrol 0 - 200 mg/dL 150 137 -   LDLCALC 0 - 99 mg/dL 72 69 -   HDL >40 mg/dL 54 47 -   Trlycerides <150 mg/dL 118 104 -   Hemoglobin A1c 4.8 - 5.6 % 6.7(H) - 5.7(H)   TCO2 22 - 32 mmol/L 21(L) - -      Capillary Blood Glucose: Lab Results  Component Value Date   GLUCAP 123 (H) 06/23/2017     Exercise Target Goals:    Exercise Program Goal: Individual exercise prescription set using results from initial 6 min walk test and THRR while considering  patient's activity barriers and safety.   Exercise Prescription Goal: Starting with aerobic activity 30 plus minutes a day, 3 days per week for initial exercise prescription. Provide home exercise prescription and guidelines that participant acknowledges understanding prior to discharge.  Activity Barriers & Risk Stratification: Activity Barriers & Cardiac Risk Stratification - 07/22/17 1357      Activity Barriers & Cardiac Risk Stratification   Activity Barriers  None    Cardiac Risk Stratification  High       6 Minute  Walk: 6 Minute Walk    Row Name 07/22/17 1357         6 Minute Walk   Phase  Initial     Distance  1450 feet     Distance % Change  0 %     Distance Feet Change  0 ft     Walk Time  6 minutes     # of Rest Breaks  0     MPH  2.74     METS  3.1     RPE  9     Perceived Dyspnea   8     VO2 Peak  15.8     Symptoms  No     Resting HR  78 bpm     Resting BP  122/82     Resting Oxygen Saturation   97 %     Exercise Oxygen Saturation  during 6 min walk  90 %     Max Ex. HR  120 bpm     Max Ex. BP  160/86     2 Minute Post BP  124/76        Oxygen Initial Assessment:   Oxygen Re-Evaluation:   Oxygen Discharge (Final Oxygen  Re-Evaluation):   Initial Exercise Prescription: Initial Exercise Prescription - 07/22/17 1500      Elliptical   Level  --    Speed  --    Minutes  --    METs  --      Prescription Details   Frequency (times per week)  --    Duration  --      Intensity   THRR 40-80% of Max Heartrate  --    Ratings of Perceived Exertion  --    Perceived Dyspnea  --      Progression   Progression  --      Resistance Training   Training Prescription  --    Weight  --    Reps  --       Perform Capillary Blood Glucose checks as needed.  Exercise Prescription Changes:  Exercise Prescription Changes    Row Name 07/27/17 1306 08/05/17 1500 08/30/17 1400 09/14/17 0700 09/24/17 1500     Response to Exercise   Blood Pressure (Admit)  122/68  108/64  126/74  110/70  110/60   Blood Pressure (Exercise)  150/80  180/82  150/90  170/80  158/82   Blood Pressure (Exit)  110/54  100/62  98/70  96/70  90/56   Heart Rate (Admit)  59 bpm  64 bpm  52 bpm  65 bpm  51 bpm   Heart Rate (Exercise)  94 bpm  106 bpm  86 bpm  101 bpm  94 bpm   Heart Rate (Exit)  70 bpm  74 bpm  61 bpm  73 bpm  61 bpm   Rating of Perceived Exertion (Exercise)  _0 Duration  Progress to 30 minutes of  aerobic without signs/symptoms of physical distress  Progress to 30 minutes of  aerobic without signs/symptoms of physical distress  Progress to 30 minutes of  aerobic without signs/symptoms of physical distress  Progress to 30 minutes of  aerobic without signs/symptoms of physical distress  Progress to 30 minutes of  aerobic without signs/symptoms of physical distress   Intensity  THRR New [phone]  [facility] New [phone]  [facility] New [phone]  THRR New [phone]  THRR New [phone]  Progression   Progression  Continue to progress workloads to maintain intensity without signs/symptoms of physical distress.  Continue to progress workloads to maintain intensity without signs/symptoms of physical distress.   Continue to progress workloads to maintain intensity without signs/symptoms of physical distress.  Continue to progress workloads to maintain intensity without signs/symptoms of physical distress.  Continue to progress workloads to maintain intensity without signs/symptoms of physical distress.     Resistance Training   Training Prescription  Yes  Yes  Yes  Yes  Yes   Weight  _0 Reps  10-15  10-15  10-15  10-15  10-15     Treadmill   MPH  2.2  2.5  2._1 Grade  0  0  0  0  0   Minutes  _2 METs  2.6  2.9  3.06  3.2  3.2     Elliptical   Level  _3 Speed  2.5  38  38  38  30   Minutes  _4 METs  2.2  2.1  2.9  2.9  2.7      Exercise Comments:  Exercise Comments    Row Name 07/27/17 1308 08/05/17 1518 08/30/17 1427 09/14/17 0757 09/24/17 1507   Exercise Comments  Patient has just started CR and will be progressed in time.   Patient continues to do well in CR and has been doing very well on the stand up elliptical machine. Patient has increased his speed on the treadmill as well to 2.5. Patients blood pressure is still somewhat high during exercise and will continued to be monitored throughout the remainder of the program. Patient will be progressedin time more since still only on his 7th session.   Patien tcontinues to do well in CR. Patient states that he feels that he has gained strength and stamina back for work if he is approved for returning. Patient also has increased his speed on the treadmill to 2.50mh while maintaining his level and watts on the stand up elliptical. Patient will continued to be monitored throughout the remainder of the program.   Patinet continues to do well in CR and he states that he feels strong enough to return to work if he is cleared by his doctors. Patient has increased his speed on the treadmill to 3.0 and has maintained his level and watts on the standing elliptical as well.   Patinet continues  to do well in CR and has maintained his levels on both of his current machines. Patient has stated that he feels so much better and that he has much more energy and stamina since beginning the program. 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      Exercise Goals and Review:  Exercise Goals    [place] 07[place]17 1359             Exercise Goals   Increase Physical Activity  Yes       Intervention  Provide advice, education, support and counseling about physical activity/exercise needs.;Develop an individualized exercise prescription for aerobic and resistive training based on initial  evaluation findings, risk stratification, comorbidities and participant's personal goals.       Expected Outcomes  Short Term: Attend rehab on a regular basis to increase amount of physical activity.       Increase Strength and Stamina  Yes       Intervention  Develop an individualized exercise prescription for aerobic and resistive training based on initial evaluation findings, risk stratification, comorbidities and participant's personal goals.;Provide advice, education, support and counseling about physical activity/exercise needs.       Expected Outcomes  Short Term: Increase workloads from initial exercise prescription for resistance, speed, and METs.       Able to understand and use rate of perceived exertion (RPE) scale  Yes       Intervention  Provide education and explanation on how to use RPE scale       Expected Outcomes  Short Term: Able to use RPE daily in rehab to express subjective intensity level;Long Term:  Able to use RPE to guide intensity level when exercising independently       Able to understand and use Dyspnea scale  Yes       Intervention  Provide education and explanation on how to use Dyspnea scale       Expected Outcomes  Short Term: Able to use Dyspnea scale daily  in rehab to express subjective sense of shortness of breath during exertion;Long Term: Able to use Dyspnea scale to guide intensity level when exercising independently       Knowledge and understanding of Target Heart Rate Range (THRR)  Yes       Intervention  Provide education and explanation of THRR including how the numbers were predicted and where they are located for reference       Expected Outcomes  Long Term: Able to use THRR to govern intensity when exercising independently;Short Term: Able to state/look up THRR;Short Term: Able to use daily as guideline for intensity in rehab       Able to check pulse independently  Yes       Intervention  Provide education and demonstration on how to check pulse in carotid and radial arteries.;Review the importance of being able to check your own pulse for safety during independent exercise       Expected Outcomes  Short Term: Able to explain why pulse checking is important during independent exercise;Long Term: Able to check pulse independently and accurately       Understanding of Exercise Prescription  Yes       Intervention  Provide education, explanation, and written materials on patient's individual exercise prescription       Expected Outcomes  Short Term: Able to explain program exercise prescription;Long Term: Able to explain home exercise prescription to exercise independently          Exercise Goals Re-Evaluation : Exercise Goals Re-Evaluation    Row Name 08/05/17 1517 08/30/17 1425 09/24/17 1506         Exercise Goal Re-Evaluation   Exercise Goals Review  Increase Physical Activity;Able to understand and use rate of perceived exertion (RPE) scale;Knowledge and understanding of Target Heart Rate Range (THRR);Understanding of Exercise Prescription;Able to understand and use Dyspnea scale  Increase Physical Activity;Able to understand and use rate of perceived exertion (RPE) scale;Knowledge and understanding of Target Heart Rate Range  (THRR);Understanding of Exercise Prescription;Able to understand and use Dyspnea scale  Increase Physical Activity;Able to understand and use rate of perceived exertion (RPE) scale;Knowledge and understanding of Target Heart Rate  Range (THRR);Understanding of Exercise Prescription;Able to understand and use Dyspnea scale     Comments  Patient continues to do well in CR and has been doing very well on the stand up elliptical machine. Patient has increased his speed on the treadmill as well to 2.5. Patients blood pressure is still somewhat high during exercise and will continued to be monitored throughout the remainder of the program. Patient will be progressedin time more since still only on his 7th session.   Patien tcontinues to do well in CR. Patient states that he feels that he has gained strength and stamina back for work if he is approved for returning. Patient also has increased his speed on the treadmill to 2.30mh while maintaining his level and watts on the stand up elliptical. Patient will continued to be monitored throughout the remainder of the program.   Patinet continues to do well in CR and has maintained his levels on both of his current machines. Patient has stated that he feels so much better and that he has much more energy and stamina since beginning the program. 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     Expected Outcomes  Patient wishes to get back to work and to be able to work without any health problems.   Patient wishes to get back to work and to be able to work without any health problems.   Patient wishes to get back to work and to be able to work without any health problems.          Discharge Exercise Prescription (Final Exercise Prescription Changes): Exercise Prescription Changes - 09/24/17 1500      Response to Exercise   Blood Pressure (Admit)   110/60    Blood Pressure (Exercise)  158/82    Blood Pressure (Exit)  90/56    Heart Rate (Admit)  51 bpm    Heart Rate (Exercise)  94 bpm    Heart Rate (Exit)  61 bpm    Rating of Perceived Exertion (Exercise)  11    Duration  Progress to 30 minutes of  aerobic without signs/symptoms of physical distress    Intensity  THRR New [phone]     Progression   Progression  Continue to progress workloads to maintain intensity without signs/symptoms of physical distress.      Resistance Training   Training Prescription  Yes    Weight  5    Reps  10-15      Treadmill   MPH  3    Grade  0    Minutes  20    METs  3.2      Elliptical   Level  1    Speed  30    Minutes  15    METs  2.7       Nutrition:  Target Goals: Understanding of nutrition guidelines, daily intake of sodium <15069m cholesterol <20067mcalories 30% from fat and 7% or less from saturated fats, daily to have 5 or more servings of fruits and vegetables.  Biometrics: Pre Biometrics - 07/22/17 1400      Pre Biometrics   Height  _0  (1.905 m)    Weight  212 lb 6.4 oz (96.3 kg)    Waist Circumference  37.5 inches    Hip Circumference  40 inches  Waist to Hip Ratio  0.94 %    BMI (Calculated)  26.55    Triceps Skinfold  6 mm    % Body Fat  21.3 %    Grip Strength  67.4 kg    Flexibility  18.83 in    Single Leg Stand  23 seconds        Nutrition Therapy Plan and Nutrition Goals: Nutrition Therapy & Goals - 08/12/17 1528      Personal Nutrition Goals   Nutrition Goal  For heart healthy choices add >50% of whole grains, make half their plate fruits and vegetables. Discuss the difference between starchy vegetables and leafy greens, and how leafy vegetables provide fiber, helps maintain healthy weight, helps control blood glucose, and lowers cholesterol.  Discuss purchasing fresh or frozen vegetable to reduce sodium and not to add grease, fat or sugar. Consume <18oz of red meat per week. Consume lean cuts  of meats and very little of meats high in sodium and nitrates such as pork and lunch meats. Discussed portion control for all food groups.      Personal Goal #2  Patient continues to eat heart healthy. No salt, non fat products, no fired food, and lean meats. He plans to attend the RD appointment tomorrow.     Additional Goals?  No    Comments  Patient met with RD 08/12/17.      Intervention Plan   Intervention  Nutrition handout(s) given to patient.    Expected Outcomes  Short Term Goal: Understand basic principles of dietary content, such as calories, fat, sodium, cholesterol and nutrients.;Long Term Goal: Adherence to prescribed nutrition plan.       Nutrition Assessments: Nutrition Assessments - 07/22/17 1528      MEDFICTS Scores   Pre Score  6       Nutrition Goals Re-Evaluation: Nutrition Goals Re-Evaluation    [place] Name 09/08/17 1316 09/28/17 0752           Goals   Current Weight  205 lb (93 kg)  202 lb 6.4 oz (91.8 kg)      Nutrition Goal  For heart healthy choices add >50% of whole grains, make half their plate fruits and vegetables. Discuss the difference between starchy vegetables and leafy greens, and how leafy vegetables provide fiber, helps maintain healthy weight, helps control blood glucose, and lowers cholesterol.  Discuss purchasing fresh or frozen vegetable to reduce sodium and not to add grease, fat or sugar. Consume <18oz of red meat per week. Consume lean cuts of meats and very little of meats high in sodium and nitrates such as pork and lunch meats. Discussed portion control for all food groups.    For heart healthy choices add >50% of whole grains, make half their plate fruits and vegetables. Discuss the difference between starchy vegetables and leafy greens, and how leafy vegetables provide fiber, helps maintain healthy weight, helps control blood glucose, and lowers cholesterol.  Discuss purchasing fresh or frozen vegetable to reduce sodium and not to add grease,  fat or sugar. Consume <18oz of red meat per week. Consume lean cuts of meats and very little of meats high in sodium and nitrates such as pork and lunch meats. Discussed portion control for all food groups.        Comment  Patient has lost 7.4 lbs since his initial visit. He says he is trying to eat healthy and eat smaller portions. Will continue to monitor for progress.   Patient has lost 3  lbs since last 30 day review. He says he continues to eat healthy and smaller portions. Will continue to monitor for progress.       Expected Outcome  Patient will continue to work toward meeting his nutritional goals.   Patient will continue to work toward meeting his nutritional goals.         Personal Goal #2 Re-Evaluation   Personal Goal #2  Patient continues to eat heart healthy. No salt, non fat products, no fired food, and lean meats. He plans to attend the RD appointment tomorrow.   Patient continues to eat heart healthy. No salt, non fat products, no fired food, and lean meats. He plans to attend the RD appointment tomorrow.          Nutrition Goals Discharge (Final Nutrition Goals Re-Evaluation): Nutrition Goals Re-Evaluation - 09/28/17 0752      Goals   Current Weight  202 lb 6.4 oz (91.8 kg)    Nutrition Goal  For heart healthy choices add >50% of whole grains, make half their plate fruits and vegetables. Discuss the difference between starchy vegetables and leafy greens, and how leafy vegetables provide fiber, helps maintain healthy weight, helps control blood glucose, and lowers cholesterol.  Discuss purchasing fresh or frozen vegetable to reduce sodium and not to add grease, fat or sugar. Consume <18oz of red meat per week. Consume lean cuts of meats and very little of meats high in sodium and nitrates such as pork and lunch meats. Discussed portion control for all food groups.      Comment  Patient has lost 3 lbs since last 30 day review. He says he continues to eat healthy and smaller portions.  Will continue to monitor for progress.     Expected Outcome  Patient will continue to work toward meeting his nutritional goals.       Personal Goal #2 Re-Evaluation   Personal Goal #2  Patient continues to eat heart healthy. No salt, non fat products, no fired food, and lean meats. He plans to attend the RD appointment tomorrow.        Psychosocial: Target Goals: Acknowledge presence or absence of significant depression and/or stress, maximize coping skills, provide positive support system. Participant is able to verbalize types and ability to use techniques and skills needed for reducing stress and depression.  Initial Review & Psychosocial Screening: Initial Psych Review & Screening - 07/22/17 1531      Initial Review   Current issues with  None Identified      Family Dynamics   Good Support System?  Yes      Barriers   Psychosocial barriers to participate in program  There are no identifiable barriers or psychosocial needs.      Screening Interventions   Interventions  Encouraged to exercise    Expected Outcomes  Short Term goal: Identification and review with participant of any Quality of Life or Depression concerns found by scoring the questionnaire.;Long Term goal: The participant improves quality of Life and PHQ9 Scores as seen by post scores and/or verbalization of changes       Quality of Life Scores: Quality of Life - 07/22/17 1401      Quality of Life Scores   Health/Function Pre  28.4 %    Socioeconomic Pre  28.29 %    Psych/Spiritual Pre  30 %    Family Pre  27.6 %    GLOBAL Pre  28.59 %      Scores of 19 and  below usually indicate a poorer quality of life in these areas.  A difference of  2-3 points is a clinically meaningful difference.  A difference of 2-3 points in the total score of the Quality of Life Index has been associated with significant improvement in overall quality of life, self-image, physical symptoms, and general health in studies assessing  change in quality of life.  PHQ-9: Recent Review Flowsheet Data    Depression screen [facility] 2/9 07/22/2017   Decreased Interest 0   Down, Depressed, Hopeless 0   PHQ - 2 Score 0   Altered sleeping 0   Tired, decreased energy 0   Change in appetite 0   Feeling bad or failure about yourself  0   Trouble concentrating 0   Moving slowly or fidgety/restless 0   Suicidal thoughts 0   PHQ-9 Score 0     Interpretation of Total Score  Total Score Depression Severity:  1-4 = Minimal depression, 5-9 = Mild depression, 10-14 = Moderate depression, 15-19 = Moderately severe depression, 20-27 = Severe depression   Psychosocial Evaluation and Intervention: Psychosocial Evaluation - 07/22/17 1531      Psychosocial Evaluation & Interventions   Interventions  Encouraged to exercise with the program and follow exercise prescription    Continue Psychosocial Services   No Follow up required       Psychosocial Re-Evaluation: Psychosocial Re-Evaluation    Row Name 08/11/17 1316 09/08/17 1320 09/28/17 0758         Psychosocial Re-Evaluation   Current issues with  None Identified  None Identified  None Identified     Comments  Patient's initial QOL score was 28.59 and his PHQ-9 score was 0 with no psychosocial issues identified.   Patient's initial QOL score was 28.59 and his PHQ-9 score was 0 with no psychosocial issues identified.   Patient's initial QOL score was 28.59 and his PHQ-9 score was 0 with no psychosocial issues identified.      Expected Outcomes  Patient will have no psychosocial issues identified at discharge.   Patient will have no psychosocial issues identified at discharge.   Patient will have no psychosocial issues identified at discharge.      Interventions  Stress management education;Encouraged to attend Cardiac Rehabilitation for the exercise;Relaxation education  Stress management education;Encouraged to attend Cardiac Rehabilitation for the exercise;Relaxation education  Stress  management education;Encouraged to attend Cardiac Rehabilitation for the exercise;Relaxation education     Continue Psychosocial Services   No Follow up required  No Follow up required  No Follow up required        Psychosocial Discharge (Final Psychosocial Re-Evaluation): Psychosocial Re-Evaluation - 09/28/17 0758      Psychosocial Re-Evaluation   Current issues with  None Identified    Comments  Patient's initial QOL score was 28.59 and his PHQ-9 score was 0 with no psychosocial issues identified.     Expected Outcomes  Patient will have no psychosocial issues identified at discharge.     Interventions  Stress management education;Encouraged to attend Cardiac Rehabilitation for the exercise;Relaxation education    Continue Psychosocial Services   No Follow up required       Vocational Rehabilitation: Provide vocational rehab assistance to qualifying candidates.   Vocational Rehab Evaluation & Intervention: Vocational Rehab - 07/22/17 1516      Initial Vocational Rehab Evaluation & Intervention   Assessment shows need for Vocational Rehabilitation  No       Education: Education Goals: Education classes will be provided on  a weekly basis, covering required topics. Participant will state understanding/return demonstration of topics presented.  Learning Barriers/Preferences: Learning Barriers/Preferences - 07/22/17 1515      Learning Barriers/Preferences   Learning Barriers  Hearing    Learning Preferences  Skilled Demonstration;Individual Instruction;Group Instruction       Education Topics: Hypertension, Hypertension Reduction -Define heart disease and high blood pressure. Discus how high blood pressure affects the body and ways to reduce high blood pressure.   CARDIAC REHAB PHASE II EXERCISE from 09/22/2017 in [place]  Date  09/15/17  Educator  Diane coad  Instruction Review Code  2- Demonstrated Understanding      Exercise and Your  Heart -Discuss why it is important to exercise, the FITT principles of exercise, normal and abnormal responses to exercise, and how to exercise safely.   CARDIAC REHAB PHASE II EXERCISE from 09/22/2017 in [place]  Date  09/22/17  Educator  [place]  Instruction Review Code  2- Demonstrated Understanding      Angina -Discuss definition of angina, causes of angina, treatment of angina, and how to decrease risk of having angina.   Cardiac Medications -Review what the following cardiac medications are used for, how they affect the body, and side effects that may occur when taking the medications.  Medications include Aspirin, Beta blockers, calcium channel blockers, ACE Inhibitors, angiotensin receptor blockers, diuretics, digoxin, and antihyperlipidemics.   Congestive Heart Failure -Discuss the definition of CHF, how to live with CHF, the signs and symptoms of CHF, and how keep track of weight and sodium intake.   Heart Disease and Intimacy -Discus the effect sexual activity has on the heart, how changes occur during intimacy as we age, and safety during sexual activity.   Smoking Cessation / COPD -Discuss different methods to quit smoking, the health benefits of quitting smoking, and the definition of COPD.   CARDIAC REHAB PHASE II EXERCISE from 09/22/2017 in [place]  Date  07/29/17  Educator  DC  Instruction Review Code  2- Demonstrated Understanding      Nutrition I: Fats -Discuss the types of cholesterol, what cholesterol does to the heart, and how cholesterol levels can be controlled.   CARDIAC REHAB PHASE II EXERCISE from 09/22/2017 in [place]  Date  08/04/17  Educator  DC  Instruction Review Code  2- Demonstrated Understanding      Nutrition II: Labels -Discuss the different components of food labels and how to read food label   CARDIAC REHAB PHASE II EXERCISE from 09/22/2017 in [place]  Date  08/11/17  Educator  [place]  Instruction Review Code  2- Demonstrated Understanding      Heart Parts/Heart Disease and PAD -Discuss the anatomy of the heart, the pathway of blood circulation through the heart, and these are affected by heart disease.   CARDIAC REHAB PHASE II EXERCISE from 09/22/2017 in [place]  Date  08/18/17  Educator  DJ  Instruction Review Code  2- Demonstrated Understanding      Stress I: Signs and Symptoms -Discuss the causes of stress, how stress may lead to anxiety and depression, and ways to limit stress.   CARDIAC REHAB PHASE II EXERCISE from 09/22/2017 in [place]  Date  08/25/17  Educator  [place]  Instruction Review Code  2- Demonstrated Understanding      Stress II: Relaxation -Discuss different types of relaxation techniques to limit stress.   CARDIAC  REHAB PHASE II EXERCISE from 09/22/2017 in [place]  Date  09/01/17  Educator  [place]  Instruction Review Code  2- Demonstrated Understanding      Warning Signs of Stroke / TIA -Discuss definition of a stroke, what the signs and symptoms are of a stroke, and how to identify when someone is having stroke.   CARDIAC REHAB PHASE II EXERCISE from 09/22/2017 in [place]  Date  09/08/17  Educator  DC  Instruction Review Code  2- Demonstrated Understanding      Knowledge Questionnaire Score: Knowledge Questionnaire Score - 07/22/17 1515      Knowledge Questionnaire Score   Pre Score  22/24       Core Components/Risk Factors/Patient Goals at Admission: Personal Goals and Risk Factors at Admission - 07/22/17 1528      Core Components/Risk Factors/Patient Goals on Admission    Weight Management  Weight Maintenance    Personal Goal Other  Yes    Personal Goal  To get back to work.     Intervention  Attend CR program 3 x week and supplement with home exercise 2 x week.     Expected  Outcomes  Reach personal goals.        Core Components/Risk Factors/Patient Goals Review:  Goals and Risk Factor Review    Row Name 08/11/17 1314 09/08/17 1318 09/28/17 0756         Core Components/Risk Factors/Patient Goals Review   Personal Goals Review  Weight Management/Obesity Get back to work; work without any problems.   Weight Management/Obesity Get back to work; working without any problesm.   Weight Management/Obesity Get back to work; Working without problems.      Review  Patient has completed 10 sessions losing 9.4 lbs since he started. He is doing well in the program with progression. He says he has cut out all sugar from his diet. He says he feels stronger and hopes to get stronger as he continues the program. Will continue to monitor.   Patient has complted 22 sessions losing 7.4 lbs since his initial visit. He continues to do well in the program with progression. He continues to say he feels stronger and feels like doing more around the house. He has more energy and feels more like he is able now to return to his job but plans to complete the program before he returns. Will continue to monitor for progress.   Patient has completed 29 sessions losing 5 lbs since last 30 day review. He is very pleased with his progress in the program. He continues to do well with progression. He continues to say he feels stronger and feels like he is able to return to work now. He has more energy and stamina. He was recently hospitalized with a small bowel obstruction which resolved on it's own with bowel rest. He is symptom free now. Will continue to monitor for progress.      Expected Outcomes  Patient will continue to attend sessions and complete the program meeting his personal needs.   Patient will continue to attend sessions and complete the program meeting his personal needs.   Patient will continue to attend sessions and complete the program meeting his personal needs.         Core  Components/Risk Factors/Patient Goals at Discharge (Final Review):  Goals and Risk Factor Review - 09/28/17 0756      Core Components/Risk Factors/Patient Goals Review   Personal Goals Review  Weight  Management/Obesity Get back to work; Working without problems.     Review  Patient has completed 29 sessions losing 5 lbs since last 30 day review. He is very pleased with his progress in the program. He continues to do well with progression. He continues to say he feels stronger and feels like he is able to return to work now. He has more energy and stamina. He was recently hospitalized with a small bowel obstruction which resolved on it's own with bowel rest. He is symptom free now. Will continue to monitor for progress.     Expected Outcomes  Patient will continue to attend sessions and complete the program meeting his personal needs.        ITP Comments:   Comments: ITP 30 Day REVIEW Patient doing well in the program. Will continue to monitor for progress.

## 2017-09-28 NOTE — Progress Notes (Signed)
[facility] Note   HPI:  57 y.o. Male presents to clinic for follow-up evaluation after he was hospitalized with a pSBO likely related to thickened small bowel found on CT scan. Concern for enteritis versus Crohn's. He is doing well and denies any abdominal pain or issues with his bowels.   Review of Systems:  No fevers or chills Normal BMs No pain All other review of systems: otherwise negative   Vital Signs:  BP 121/72 (BP Location: Left Arm, Patient Position: Sitting, Cuff Size: Normal)   Pulse (!) 52   Temp (!) 97.4 F (36.3 C) (Temporal)   Resp 16   Wt 201 lb (91.2 kg)   BMI 25.12 kg/m    Physical Exam:  Physical Exam  Constitutional: He appears well-developed and well-nourished.  HENT:  Head: Normocephalic.  Eyes: Pupils are equal, round, and reactive to light.  Cardiovascular: Normal rate and regular rhythm.  Pulmonary/Chest: Effort normal.  Abdominal: Soft. He exhibits no distension. There is no tenderness.  Vitals reviewed.   Laboratory studies: None  Imaging:  None   Assessment:  57 y.o. yo Male with what was likely a pSBO related to enteritis. We are doing a CT enterography at 4 weeks from the prior CT to rule out any continued thickening of the bowel which would make Crohn's or a SB malignancy more likely. Have low suspicion but do want to reassess given the strange appearance on his prior CT.   Plan:  - CT enterography on or after 6/6  - Will call with results   All of the above recommendations were discussed with the patient, and all of patient's questions were answered to his expressed satisfaction.  Curlene Labrum, MD [facility] [address] [place], [place] [address] [phone] (office)

## 2017-09-29 ENCOUNTER — Encounter (HOSPITAL_COMMUNITY)
Admission: RE | Admit: 2017-09-29 | Discharge: 2017-09-29 | Disposition: A | Discharge status: Home / Self Care | Payer: PRIVATE HEALTH INSURANCE | Source: Ambulatory Visit | Attending: Cardiovascular Disease | Admitting: Cardiovascular Disease

## 2017-09-29 ENCOUNTER — Other Ambulatory Visit: Payer: Self-pay | Admitting: General Surgery

## 2017-09-29 DIAGNOSIS — Z955 Presence of coronary angioplasty implant and graft: Secondary | ICD-10-CM

## 2017-09-29 DIAGNOSIS — K639 Disease of intestine, unspecified: Secondary | ICD-10-CM

## 2017-09-29 DIAGNOSIS — I213 ST elevation (STEMI) myocardial infarction of unspecified site: Secondary | ICD-10-CM | POA: Diagnosis not present

## 2017-09-29 DIAGNOSIS — I2121 ST elevation (STEMI) myocardial infarction involving left circumflex coronary artery: Secondary | ICD-10-CM

## 2017-09-29 DIAGNOSIS — K56600 Partial intestinal obstruction, unspecified as to cause: Secondary | ICD-10-CM

## 2017-09-29 NOTE — Progress Notes (Signed)
Daily Session Note  Patient Details  Name: Daniel Mcgee MRN: [id number] Date of Birth: May 29, 1960 Referring Provider:     CARDIAC REHAB PHASE II ORIENTATION from 07/22/2017 in [place]  Referring Provider  [name]       Encounter Date: 09/29/2017  Check In: Session Check In - 09/29/17 1240      Check-In   Location  [facility]    Staff Present  Suzanne Boron, BS, EP, Exercise Physiologist;Diane Coad, MS, EP, [facility], Exercise Physiologist    Supervising physician immediately available to respond to emergencies  See telemetry face sheet for immediately available MD    Medication changes reported      No    Fall or balance concerns reported     No    Warm-up and Cool-down  Performed as group-led instruction    Resistance Training Performed  Yes    VAD Patient?  No      Pain Assessment   Currently in Pain?  No/denies    Pain Score  0-No pain    Multiple Pain Sites  No       Capillary Blood Glucose: No results found for this or any previous visit (from the past 24 hour(s)).    Social History   Tobacco Use  Smoking Status Never Smoker  Smokeless Tobacco Former Systems developer  . Types: Chew    Goals Met:  Independence with exercise equipment Exercise tolerated well No report of cardiac concerns or symptoms Strength training completed today  Goals Unmet:  Not Applicable  Comments: Check out 1200   [name] is Medical Director for [place] and Pulmonary Rehab.

## 2017-10-01 ENCOUNTER — Encounter (HOSPITAL_COMMUNITY)
Admission: RE | Admit: 2017-10-01 | Discharge: 2017-10-01 | Disposition: A | Discharge status: Home / Self Care | Payer: PRIVATE HEALTH INSURANCE | Source: Ambulatory Visit | Attending: Cardiovascular Disease | Admitting: Cardiovascular Disease

## 2017-10-01 DIAGNOSIS — Z955 Presence of coronary angioplasty implant and graft: Secondary | ICD-10-CM

## 2017-10-01 DIAGNOSIS — I213 ST elevation (STEMI) myocardial infarction of unspecified site: Secondary | ICD-10-CM | POA: Diagnosis not present

## 2017-10-01 DIAGNOSIS — I2121 ST elevation (STEMI) myocardial infarction involving left circumflex coronary artery: Secondary | ICD-10-CM

## 2017-10-01 NOTE — Progress Notes (Signed)
Daily Session Note  Patient Details  Name: Daniel Mcgee MRN: [id number] Date of Birth: 1960/08/21 Referring Provider:     [place] from 07/22/2017 in [place]  Referring Provider  [name]       Encounter Date: 10/01/2017  Check In: Session Check In - 10/01/17 1100      Check-In   Location  [facility]    Staff Present  Suzanne Boron, BS, EP, Exercise Physiologist;Diane Coad, MS, EP, [facility], Exercise Physiologist;Tarra Pence Wynetta Emery, RN, BSN    Supervising physician immediately available to respond to emergencies  See telemetry face sheet for immediately available MD    Medication changes reported      No    Fall or balance concerns reported     No    Warm-up and Cool-down  Performed as group-led instruction    Resistance Training Performed  Yes    VAD Patient?  No      Pain Assessment   Currently in Pain?  No/denies    Pain Score  0-No pain    Multiple Pain Sites  No       Capillary Blood Glucose: No results found for this or any previous visit (from the past 24 hour(s)).    Social History   Tobacco Use  Smoking Status Never Smoker  Smokeless Tobacco Former Systems developer  . Types: Chew    Goals Met:  Independence with exercise equipment Exercise tolerated well No report of cardiac concerns or symptoms Strength training completed today  Goals Unmet:  Not Applicable  Comments: Check out 1200.   [name] is Medical Director for [facility] Cardiac and Pulmonary Rehab.

## 2017-10-04 ENCOUNTER — Encounter (HOSPITAL_COMMUNITY): Payer: PRIVATE HEALTH INSURANCE

## 2017-10-06 ENCOUNTER — Encounter (HOSPITAL_COMMUNITY)
Admission: RE | Admit: 2017-10-06 | Discharge: 2017-10-06 | Disposition: A | Discharge status: Home / Self Care | Payer: PRIVATE HEALTH INSURANCE | Source: Ambulatory Visit | Attending: Cardiovascular Disease | Admitting: Cardiovascular Disease

## 2017-10-06 DIAGNOSIS — I213 ST elevation (STEMI) myocardial infarction of unspecified site: Secondary | ICD-10-CM | POA: Diagnosis not present

## 2017-10-06 NOTE — Progress Notes (Signed)
Daily Session Note  Patient Details  Name: Daniel Mcgee MRN: [id number] Date of Birth: Jul 31, 1960 Referring Provider:     CARDIAC REHAB PHASE II ORIENTATION from 07/22/2017 in [place]  Referring Provider  [name]       Encounter Date: 10/06/2017  Check In: Session Check In - 10/06/17 1100      Check-In   Staff Present  Shawntez Dickison Angelina Pih, MS, EP, [facility], Exercise Physiologist;Gregory Luther Parody, BS, EP, Exercise Physiologist    Supervising physician immediately available to respond to emergencies  See telemetry face sheet for immediately available MD    Medication changes reported      No    Fall or balance concerns reported     No    Tobacco Cessation  No Change    Warm-up and Cool-down  Performed as group-led instruction    Resistance Training Performed  Yes    VAD Patient?  No      Pain Assessment   Currently in Pain?  No/denies    Pain Score  0-No pain    Multiple Pain Sites  No       Capillary Blood Glucose: No results found for this or any previous visit (from the past 24 hour(s)).    Social History   Tobacco Use  Smoking Status Never Smoker  Smokeless Tobacco Former Systems developer  . Types: Chew    Goals Met:  Independence with exercise equipment Exercise tolerated well No report of cardiac concerns or symptoms Strength training completed today  Goals Unmet:  Not Applicable  Comments: Check out: 12:00   [name] is Medical Director for [place] and Pulmonary Rehab.

## 2017-10-08 ENCOUNTER — Encounter (HOSPITAL_COMMUNITY)
Admission: RE | Admit: 2017-10-08 | Discharge: 2017-10-08 | Disposition: A | Discharge status: Home / Self Care | Payer: PRIVATE HEALTH INSURANCE | Source: Ambulatory Visit | Attending: Cardiovascular Disease | Admitting: Cardiovascular Disease

## 2017-10-08 DIAGNOSIS — I2121 ST elevation (STEMI) myocardial infarction involving left circumflex coronary artery: Secondary | ICD-10-CM

## 2017-10-08 DIAGNOSIS — I213 ST elevation (STEMI) myocardial infarction of unspecified site: Secondary | ICD-10-CM | POA: Diagnosis not present

## 2017-10-08 DIAGNOSIS — Z955 Presence of coronary angioplasty implant and graft: Secondary | ICD-10-CM

## 2017-10-08 NOTE — Progress Notes (Signed)
Daily Session Note  Patient Details  Name: Daniel Mcgee MRN: [id number] Date of Birth: [birth year] Referring Provider:     CARDIAC REHAB PHASE II ORIENTATION from 07/22/2017 in [place]  Referring Provider  [name]       Encounter Date: 10/08/2017  Check In: Session Check In - 10/08/17 1100      Check-In   Location  [facility]    Staff Present  Suzanne Boron, BS, EP, Exercise Physiologist;Paticia Moster Wynetta Emery, RN, BSN    Supervising physician immediately available to respond to emergencies  See telemetry face sheet for immediately available MD    Medication changes reported      No    Fall or balance concerns reported     No    Warm-up and Cool-down  Performed as group-led instruction    Resistance Training Performed  Yes    VAD Patient?  No      Pain Assessment   Currently in Pain?  No/denies    Pain Score  0-No pain    Multiple Pain Sites  No       Capillary Blood Glucose: No results found for this or any previous visit (from the past 24 hour(s)).    Social History   Tobacco Use  Smoking Status Never Smoker  Smokeless Tobacco Former Systems developer  . Types: Chew    Goals Met:  Independence with exercise equipment Exercise tolerated well No report of cardiac concerns or symptoms Strength training completed today  Goals Unmet:  Not Applicable  Comments: Check out 1200.   [name] is Medical Director for [facility] Cardiac and Pulmonary Rehab.

## 2017-10-11 ENCOUNTER — Encounter (HOSPITAL_COMMUNITY)
Admission: RE | Admit: 2017-10-11 | Discharge: 2017-10-11 | Disposition: A | Discharge status: Home / Self Care | Payer: PRIVATE HEALTH INSURANCE | Source: Ambulatory Visit | Attending: Cardiovascular Disease | Admitting: Cardiovascular Disease

## 2017-10-11 DIAGNOSIS — Z8249 Family history of ischemic heart disease and other diseases of the circulatory system: Secondary | ICD-10-CM | POA: Insufficient documentation

## 2017-10-11 DIAGNOSIS — E785 Hyperlipidemia, unspecified: Secondary | ICD-10-CM | POA: Diagnosis not present

## 2017-10-11 DIAGNOSIS — Z7902 Long term (current) use of antithrombotics/antiplatelets: Secondary | ICD-10-CM | POA: Diagnosis not present

## 2017-10-11 DIAGNOSIS — Z955 Presence of coronary angioplasty implant and graft: Secondary | ICD-10-CM | POA: Insufficient documentation

## 2017-10-11 DIAGNOSIS — Z79899 Other long term (current) drug therapy: Secondary | ICD-10-CM | POA: Diagnosis not present

## 2017-10-11 DIAGNOSIS — I2121 ST elevation (STEMI) myocardial infarction involving left circumflex coronary artery: Secondary | ICD-10-CM

## 2017-10-11 DIAGNOSIS — I213 ST elevation (STEMI) myocardial infarction of unspecified site: Secondary | ICD-10-CM | POA: Insufficient documentation

## 2017-10-11 DIAGNOSIS — Z87891 Personal history of nicotine dependence: Secondary | ICD-10-CM | POA: Insufficient documentation

## 2017-10-11 DIAGNOSIS — Z7982 Long term (current) use of aspirin: Secondary | ICD-10-CM | POA: Diagnosis not present

## 2017-10-11 NOTE — Progress Notes (Signed)
Daily Session Note  Patient Details  Name: Daniel Mcgee MRN: [id number] Date of Birth: Aug 02, 1960 Referring Provider:     CARDIAC REHAB PHASE II ORIENTATION from 07/22/2017 in [place]  Referring Provider  [name]       Encounter Date: 10/11/2017  Check In: Session Check In - 10/11/17 1124      Check-In   Location  [facility]    Staff Present  Suzanne Boron, BS, EP, Exercise Physiologist;Debra Wynetta Emery, RN, BSN;Diane Coad, MS, EP, [facility], Exercise Physiologist    Supervising physician immediately available to respond to emergencies  See telemetry face sheet for immediately available MD    Medication changes reported      No    Fall or balance concerns reported     No    Warm-up and Cool-down  Performed as group-led instruction    Resistance Training Performed  Yes    VAD Patient?  No      Pain Assessment   Currently in Pain?  No/denies    Pain Score  0-No pain    Multiple Pain Sites  No       Capillary Blood Glucose: No results found for this or any previous visit (from the past 24 hour(s)).    Social History   Tobacco Use  Smoking Status Never Smoker  Smokeless Tobacco Former Systems developer  . Types: Chew    Goals Met:  Independence with exercise equipment Exercise tolerated well No report of cardiac concerns or symptoms Strength training completed today  Goals Unmet:  Not Applicable  Comments: Check out 1200   [name] is Medical Director for [place] and Pulmonary Rehab.

## 2017-10-13 ENCOUNTER — Encounter (HOSPITAL_COMMUNITY)
Admission: RE | Admit: 2017-10-13 | Discharge: 2017-10-13 | Disposition: A | Discharge status: Home / Self Care | Payer: PRIVATE HEALTH INSURANCE | Source: Ambulatory Visit | Attending: Cardiovascular Disease | Admitting: Cardiovascular Disease

## 2017-10-13 DIAGNOSIS — Z955 Presence of coronary angioplasty implant and graft: Secondary | ICD-10-CM

## 2017-10-13 DIAGNOSIS — I213 ST elevation (STEMI) myocardial infarction of unspecified site: Secondary | ICD-10-CM | POA: Diagnosis not present

## 2017-10-13 DIAGNOSIS — I2121 ST elevation (STEMI) myocardial infarction involving left circumflex coronary artery: Secondary | ICD-10-CM

## 2017-10-13 NOTE — Progress Notes (Signed)
Daily Session Note  Patient Details  Name: VIRAJ LIBY MRN: [id number] Date of Birth: 01/21/1961 Referring Provider:     CARDIAC REHAB PHASE II ORIENTATION from 07/22/2017 in [place]  Referring Provider  [name]       Encounter Date: 10/13/2017  Check In: Session Check In - 10/13/17 1100      Check-In   Location  [facility]    Staff Present  Aundra Dubin, RN, BSN;Diane Coad, MS, EP, [facility], Exercise Physiologist    Supervising physician immediately available to respond to emergencies  See telemetry face sheet for immediately available MD    Medication changes reported      No    Fall or balance concerns reported     No    Warm-up and Cool-down  Performed as group-led instruction    Resistance Training Performed  Yes    VAD Patient?  No      Pain Assessment   Currently in Pain?  No/denies    Pain Score  0-No pain    Multiple Pain Sites  No       Capillary Blood Glucose: No results found for this or any previous visit (from the past 24 hour(s)).    Social History   Tobacco Use  Smoking Status Never Smoker  Smokeless Tobacco Former Systems developer  . Types: Chew    Goals Met:  Independence with exercise equipment Exercise tolerated well No report of cardiac concerns or symptoms Strength training completed today  Goals Unmet:  Not Applicable  Comments: Check out 1200.   [name] is Medical Director for [facility] Cardiac and Pulmonary Rehab.

## 2017-10-15 ENCOUNTER — Ambulatory Visit (HOSPITAL_COMMUNITY)
Admission: RE | Admit: 2017-10-15 | Discharge: 2017-10-15 | Disposition: A | Discharge status: Home / Self Care | Payer: PRIVATE HEALTH INSURANCE | Source: Ambulatory Visit | Attending: General Surgery | Admitting: General Surgery

## 2017-10-15 ENCOUNTER — Encounter (HOSPITAL_COMMUNITY)
Admission: RE | Admit: 2017-10-15 | Discharge: 2017-10-15 | Disposition: A | Discharge status: Home / Self Care | Payer: PRIVATE HEALTH INSURANCE | Source: Ambulatory Visit | Attending: Cardiovascular Disease | Admitting: Cardiovascular Disease

## 2017-10-15 VITALS — Ht 75.0 in | Wt 202.8 lb

## 2017-10-15 DIAGNOSIS — N400 Enlarged prostate without lower urinary tract symptoms: Secondary | ICD-10-CM | POA: Diagnosis not present

## 2017-10-15 DIAGNOSIS — K56600 Partial intestinal obstruction, unspecified as to cause: Secondary | ICD-10-CM | POA: Insufficient documentation

## 2017-10-15 DIAGNOSIS — I700 Atherosclerosis of aorta: Secondary | ICD-10-CM | POA: Diagnosis not present

## 2017-10-15 DIAGNOSIS — Z955 Presence of coronary angioplasty implant and graft: Secondary | ICD-10-CM

## 2017-10-15 DIAGNOSIS — I213 ST elevation (STEMI) myocardial infarction of unspecified site: Secondary | ICD-10-CM | POA: Diagnosis not present

## 2017-10-15 DIAGNOSIS — I2121 ST elevation (STEMI) myocardial infarction involving left circumflex coronary artery: Secondary | ICD-10-CM

## 2017-10-15 DIAGNOSIS — I2510 Atherosclerotic heart disease of native coronary artery without angina pectoris: Secondary | ICD-10-CM | POA: Diagnosis not present

## 2017-10-15 DIAGNOSIS — K639 Disease of intestine, unspecified: Secondary | ICD-10-CM | POA: Diagnosis present

## 2017-10-15 MED ORDER — BARIUM SULFATE 0.1 % PO SUSP: 450.0000 mL | Freq: Once | ORAL | Status: DC

## 2017-10-15 MED ORDER — IOPAMIDOL (ISOVUE-300) INJECTION 61%
100.0000 mL | Freq: Once | INTRAVENOUS | Status: AC | PRN
  Administered 2017-10-15: 100 mL via INTRAVENOUS

## 2017-10-15 MED ORDER — BARIUM SULFATE 0.1 % PO SUSP
ORAL | Status: DC
Start: 2017-10-15 — End: 2017-10-16
  Filled 2017-10-15: qty 3

## 2017-10-15 NOTE — Progress Notes (Signed)
Daily Session Note  Patient Details  Name: Daniel Mcgee MRN: [id number] Date of Birth: 03/21/1961 Referring Provider:     CARDIAC REHAB PHASE II ORIENTATION from 07/22/2017 in [place]  Referring Provider  [name]       Encounter Date: 10/15/2017  Check In: Session Check In - 10/15/17 1100      Check-In   Location  [facility]    Staff Present  Atzin Buchta Angelina Pih, MS, EP, [facility], Exercise Physiologist;Debra Wynetta Emery, RN, BSN    Supervising physician immediately available to respond to emergencies  See telemetry face sheet for immediately available MD    Medication changes reported      No    Fall or balance concerns reported     No    Tobacco Cessation  No Change    Warm-up and Cool-down  Performed as group-led instruction    Resistance Training Performed  Yes    VAD Patient?  No      Pain Assessment   Currently in Pain?  No/denies    Pain Score  0-No pain    Multiple Pain Sites  No       Capillary Blood Glucose: No results found for this or any previous visit (from the past 24 hour(s)).    Social History   Tobacco Use  Smoking Status Never Smoker  Smokeless Tobacco Former Systems developer  . Types: Chew    Goals Met:  Independence with exercise equipment Using PLB without cueing & demonstrates good technique Exercise tolerated well Personal goals reviewed No report of cardiac concerns or symptoms Strength training completed today  Goals Unmet:  Not Applicable  Comments: Check out: 1200   [name] is Medical Director for [place] and Pulmonary Rehab.

## 2017-10-18 ENCOUNTER — Telehealth: Payer: Self-pay | Admitting: General Surgery

## 2017-10-18 NOTE — Telephone Encounter (Signed)
[facility]  CT enterography-  EXAM: CT ABDOMEN AND PELVIS WITH CONTRAST (ENTEROGRAPHY)  TECHNIQUE: Multidetector CT of the abdomen and pelvis during bolus administration of intravenous contrast. Negative oral contrast was given.  CONTRAST:  194mL ISOVUE-300 IOPAMIDOL (ISOVUE-300) INJECTION 61%  COMPARISON:  09/16/2017 CT abdomen/pelvis.  FINDINGS: Lower chest: No significant pulmonary nodules or acute consolidative airspace disease. Coronary atherosclerosis.  Hepatobiliary: Normal liver size. No liver mass. Normal gallbladder with no radiopaque cholelithiasis. No biliary ductal dilatation.  Pancreas: Normal, with no mass or duct dilation.  Spleen: Normal size. No mass.  Adrenals/Urinary Tract: Normal adrenals. Simple 1.4 cm posterior interpolar right renal cyst. No hydronephrosis. Normal bladder.  Stomach/Bowel: Normal non-distended stomach. Normal caliber small bowel with no small bowel wall thickening. No small bowel wall hyperenhancement. Previously visualized dilated proximal small bowel loops are normal caliber on today's scan. No focal small bowel caliber transition. No pneumatosis. No fistula. Normal appendix. Normal large bowel with no diverticulosis, large bowel wall thickening or pericolonic fat stranding.  Vascular/Lymphatic: Atherosclerotic nonaneurysmal abdominal aorta. Patent portal, splenic, hepatic and renal veins. No pathologically enlarged lymph nodes in the abdomen or pelvis. Haziness of the nonenlarged left and central mesenteric lymph nodes is decreased in prominence.  Reproductive: Mild prostatomegaly with nonspecific internal prostatic calcification.  Other: No pneumoperitoneum, ascites or focal fluid collection.  Musculoskeletal: No aggressive appearing focal osseous lesions. Bilateral L5 pars defects, chronic. Moderate thoracolumbar spondylosis. Stable chronic posttraumatic change adjacent to  left ischium.  IMPRESSION: 1. Previously visualized small bowel obstruction has resolved. No residual dilated or thick walled small bowel loops. No evidence of bowel obstruction or active bowel inflammation on today's scan. 2. Haziness of the nonenlarged mesenteric lymph nodes, decreased in prominence, suggestive of improving nonspecific mesenteric lymphadenitis. 3.  Aortic Atherosclerosis (ICD10-I70.0). 4. Coronary atherosclerosis. 5. Mild prostatomegaly. 6. Chronic bilateral L5 pars defects.   SB wnl and mesenteric nodes decreased in size. Updated patient.   Curlene Labrum, MD [facility] [address] [place], [place] [address] [phone] (office)

## 2017-10-20 NOTE — Progress Notes (Signed)
Cardiac Individual Treatment Plan  Patient Details  Name: Daniel Mcgee MRN: [id number] Date of Birth: November 12, 1960 Referring Provider:     CARDIAC REHAB PHASE II ORIENTATION from 07/22/2017 in [place]  Referring Provider  [name]       Initial Encounter Date:    CARDIAC REHAB PHASE II ORIENTATION from 07/22/2017 in [place]  Date  07/22/17  Referring Provider  [name]       Visit Diagnosis: ST elevation myocardial infarction involving left circumflex coronary artery [facility])  Status post coronary artery stent placement  Patient's Home Medications on Admission:  Current Outpatient Medications:  .  aspirin EC 81 MG tablet, Take 81 mg by mouth daily., Disp: , Rfl:  .  aspirin-sod bicarb-citric acid (ALKA-SELTZER) 325 MG TBEF tablet, Take 325 mg by mouth every 6 (six) hours as needed (For indigestion)., Disp: , Rfl:  .  atorvastatin (LIPITOR) 80 MG tablet, Take 1 tablet (80 mg total) by mouth daily at 6 PM., Disp: 90 tablet, Rfl: 1 .  bismuth subsalicylate (PEPTO BISMOL) 262 MG/15ML suspension, Take 30 mLs by mouth every 6 (six) hours as needed for indigestion., Disp: , Rfl:  .  metoprolol tartrate (LOPRESSOR) 25 MG tablet, Take 0.5 tablets (12.5 mg total) by mouth 2 (two) times daily., Disp: 90 tablet, Rfl: 1 .  nitroGLYCERIN (NITROSTAT) 0.4 MG SL tablet, Place 1 tablet (0.4 mg total) under the tongue every 5 (five) minutes x 3 doses as needed for chest pain., Disp: 25 tablet, Rfl: 2 .  ranitidine (ZANTAC) 150 MG tablet, Take 150 mg by mouth daily., Disp: , Rfl:  .  ticagrelor (BRILINTA) 90 MG TABS tablet, Take 1 tablet (90 mg total) by mouth 2 (two) times daily., Disp: 180 tablet, Rfl: 2  Past Medical History: Past Medical History:  Diagnosis Date  . Family history of premature CAD   . HLD (hyperlipidemia)   . STEMI (ST elevation myocardial infarction) ([place])    2/19 PCI/DES x1 to Lcx, mild disease in RCA, LAD, normal EF     Tobacco Use: Social History   Tobacco Use  Smoking Status Never Smoker  Smokeless Tobacco Former Systems developer  . Types: Chew    Labs: Recent Review Flowsheet Data    Labs for ITP Cardiac and Pulmonary Rehab Latest Ref Rng & Units 06/22/2017 06/23/2017 09/16/2017   Cholestrol 0 - 200 mg/dL 150 137 -   LDLCALC 0 - 99 mg/dL 72 69 -   HDL >40 mg/dL 54 47 -   Trlycerides <150 mg/dL 118 104 -   Hemoglobin A1c 4.8 - 5.6 % 6.7(H) - 5.7(H)   TCO2 22 - 32 mmol/L 21(L) - -      Capillary Blood Glucose: Lab Results  Component Value Date   GLUCAP 123 (H) 06/23/2017     Exercise Target Goals:    Exercise Program Goal: Individual exercise prescription set using results from initial 6 min walk test and THRR while considering  patient's activity barriers and safety.   Exercise Prescription Goal: Starting with aerobic activity 30 plus minutes a day, 3 days per week for initial exercise prescription. Provide home exercise prescription and guidelines that participant acknowledges understanding prior to discharge.  Activity Barriers & Risk Stratification: Activity Barriers & Cardiac Risk Stratification - 07/22/17 1357      Activity Barriers & Cardiac Risk Stratification   Activity Barriers  None    Cardiac Risk Stratification  High       6 Minute  Walk: 6 Minute Walk    Row Name 07/22/17 1357 10/19/17 1531       6 Minute Walk   Phase  Initial  Discharge    Distance  1450 feet  1800 feet    Distance % Change  0 %  24.14 %    Distance Feet Change  0 ft  250 ft    Walk Time  6 minutes  6 minutes    # of Rest Breaks  0  0    MPH  2.74  3.4    METS  3.1  3.61    RPE  9  12    Perceived Dyspnea   8  11    VO2 Peak  15.8  16.48    Symptoms  No  No    Resting HR  78 bpm  79 bpm    Resting BP  122/82  110/70    Resting Oxygen Saturation   97 %  92 %    Exercise Oxygen Saturation  during 6 min walk  90 %  96 %    Max Ex. HR  120 bpm  96 bpm    Max Ex. BP  160/86  130/70    2 Minute  Post BP  124/76  100/66       Oxygen Initial Assessment:   Oxygen Re-Evaluation:   Oxygen Discharge (Final Oxygen Re-Evaluation):   Initial Exercise Prescription: Initial Exercise Prescription - 07/22/17 1500      Elliptical   Level  --    Speed  --    Minutes  --    METs  --      Prescription Details   Frequency (times per week)  --    Duration  --      Intensity   THRR 40-80% of Max Heartrate  --    Ratings of Perceived Exertion  --    Perceived Dyspnea  --      Progression   Progression  --      Resistance Training   Training Prescription  --    Weight  --    Reps  --       Perform Capillary Blood Glucose checks as needed.  Exercise Prescription Changes:  Exercise Prescription Changes    Row Name 07/27/17 1306 08/05/17 1500 08/30/17 1400 09/14/17 0700 09/24/17 1500     Response to Exercise   Blood Pressure (Admit)  122/68  108/64  126/74  110/70  110/60   Blood Pressure (Exercise)  150/80  180/82  150/90  170/80  158/82   Blood Pressure (Exit)  110/54  100/62  98/70  96/70  90/56   Heart Rate (Admit)  59 bpm  64 bpm  52 bpm  65 bpm  51 bpm   Heart Rate (Exercise)  94 bpm  106 bpm  86 bpm  101 bpm  94 bpm   Heart Rate (Exit)  70 bpm  74 bpm  61 bpm  73 bpm  61 bpm   Rating of Perceived Exertion (Exercise)  _0 Duration  Progress to 30 minutes of  aerobic without signs/symptoms of physical distress  Progress to 30 minutes of  aerobic without signs/symptoms of physical distress  Progress to 30 minutes of  aerobic without signs/symptoms of physical distress  Progress to 30 minutes of  aerobic without signs/symptoms of physical distress  Progress to 30 minutes of  aerobic without signs/symptoms of physical  distress   Intensity  THRR New [id number]  THRR New [phone]  THRR New [phone]  THRR New [phone]  THRR New [phone]     Progression   Progression  Continue to progress workloads to maintain intensity without signs/symptoms of  physical distress.  Continue to progress workloads to maintain intensity without signs/symptoms of physical distress.  Continue to progress workloads to maintain intensity without signs/symptoms of physical distress.  Continue to progress workloads to maintain intensity without signs/symptoms of physical distress.  Continue to progress workloads to maintain intensity without signs/symptoms of physical distress.     Resistance Training   Training Prescription  Yes  Yes  Yes  Yes  Yes   Weight  _0 Reps  10-15  10-15  10-15  10-15  10-15     Treadmill   MPH  2.2  2.5  2._1 Grade  0  0  0  0  0   Minutes  _2 METs  2.6  2.9  3.06  3.2  3.2     Elliptical   Level  _3 Speed  2.5  38  38  38  30   Minutes  _4 METs  2.2  2.1  2.9  2.9  2.7   Row Name 10/08/17 1400             Response to Exercise   Blood Pressure (Admit)  110/70       Blood Pressure (Exercise)  130/70       Blood Pressure (Exit)  100/60       Heart Rate (Admit)  60 bpm       Heart Rate (Exercise)  101 bpm       Heart Rate (Exit)  68 bpm       Rating of Perceived Exertion (Exercise)  11       Duration  Progress to 30 minutes of  aerobic without signs/symptoms of physical distress       Intensity  THRR New [phone]         Progression   Progression  Continue to progress workloads to maintain intensity without signs/symptoms of physical distress.         Resistance Training   Training Prescription  Yes       Weight  8       Reps  10-15         Treadmill   MPH  3.2       Grade  0       Minutes  20       METs  3.44         Elliptical   Level  1       Speed  43       Minutes  15       METs  3.1          Exercise Comments:  Exercise Comments    Row Name 07/27/17 1308 08/05/17 1518 08/30/17 1427 09/14/17 0757 09/24/17 1507   Exercise Comments  Patient has just started CR and will be progressed in time.   Patient continues to do well in  CR and has been doing very well on the stand up elliptical machine. Patient has increased his  speed on the treadmill as well to 2.5. Patients blood pressure is still somewhat high during exercise and will continued to be monitored throughout the remainder of the program. Patient will be progressedin time more since still only on his 7th session.   Patien tcontinues to do well in CR. Patient states that he feels that he has gained strength and stamina back for work if he is approved for returning. Patient also has increased his speed on the treadmill to 2.24mh while maintaining his level and watts on the stand up elliptical. Patient will continued to be monitored throughout the remainder of the program.   Patinet continues to do well in CR and he states that he feels strong enough to return to work if he is cleared by his doctors. Patient has increased his speed on the treadmill to 3.0 and has maintained his level and watts on the standing elliptical as well.   Patinet continues to do well in CR and has maintained his levels on both of his current machines. Patient has stated that he feels so much better and that he has much more energy and stamina since beginning the program. 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   Row Name 10/08/17 1432           Exercise Comments  Patient has been doing well in Cr. Patient has recently increased his speed on the treadmill to 3.2 and has even increased his watts on the stand up elliptical. 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       Expected Outcomes  Short Term: Attend rehab on a regular basis to increase amount of physical activity.       Increase Strength and Stamina  Yes       Intervention  Develop an individualized exercise prescription for aerobic and resistive training based on initial evaluation findings, risk stratification, comorbidities and participant's personal goals.;Provide advice, education, support and counseling about physical activity/exercise needs.       Expected Outcomes  Short Term: Increase workloads from initial exercise prescription for resistance, speed, and METs.       Able to understand and use rate of perceived exertion (RPE) scale  Yes       Intervention  Provide education and explanation on how to use RPE scale       Expected Outcomes  Short Term: Able to use RPE daily in rehab to express subjective intensity level;Long Term:  Able to use RPE to guide intensity level when exercising independently       Able to understand and use Dyspnea scale  Yes       Intervention  Provide education and explanation on how to use Dyspnea scale       Expected Outcomes  Short Term: Able to use Dyspnea  scale daily in rehab to express subjective sense of shortness of breath during exertion;Long Term: Able to use Dyspnea scale to guide intensity level when exercising independently       Knowledge and understanding of Target Heart Rate Range (THRR)  Yes       Intervention  Provide education and explanation of THRR including how the numbers were predicted and where they are located for reference       Expected Outcomes  Long Term: Able to use THRR to govern intensity when exercising independently;Short Term: Able to state/look up THRR;Short Term: Able to use daily as guideline for intensity in rehab       Able to check  pulse independently  Yes       Intervention  Provide education and demonstration on how to check pulse in carotid and radial arteries.;Review the importance of being able to check your own pulse for safety during independent exercise       Expected Outcomes  Short Term: Able to explain why pulse checking is important during independent exercise;Long Term: Able to check pulse independently and accurately       Understanding of Exercise Prescription  Yes       Intervention  Provide education, explanation, and written materials on patient's individual exercise prescription       Expected Outcomes  Short Term: Able to explain program exercise prescription;Long Term: Able to explain home exercise prescription to exercise independently          Exercise Goals Re-Evaluation : Exercise Goals Re-Evaluation    Row Name 08/05/17 1517 08/30/17 1425 09/24/17 1506         Exercise Goal Re-Evaluation   Exercise Goals Review  Increase Physical Activity;Able to understand and use rate of perceived exertion (RPE) scale;Knowledge and understanding of Target Heart Rate Range (THRR);Understanding of Exercise Prescription;Able to understand and use Dyspnea scale  Increase Physical Activity;Able to understand and use rate of perceived exertion (RPE) scale;Knowledge and understanding of Target Heart Rate Range (THRR);Understanding of Exercise Prescription;Able to understand and use Dyspnea scale  Increase Physical Activity;Able to understand and use rate of perceived exertion (RPE) scale;Knowledge and understanding of Target Heart Rate Range (THRR);Understanding of Exercise Prescription;Able to understand and use Dyspnea scale     Comments  Patient continues to do well in CR and has been doing very well on the stand up elliptical machine. Patient has increased his speed on the treadmill as well to 2.5. Patients blood pressure is still somewhat high during exercise and will continued to be monitored throughout the remainder  of the program. Patient will be progressedin time more since still only on his 7th session.   Patien tcontinues to do well in CR. Patient states that he feels that he has gained strength and stamina back for work if he is approved for returning. Patient also has increased his speed on the treadmill to 2.34mh while maintaining his level and watts on the stand up elliptical. Patient will continued to be monitored throughout the remainder of the program.   Patinet continues to do well in CR and has maintained his levels on both of his current machines. Patient has stated that he feels so much better and that he has much more energy and stamina since beginning the program. 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  Expected Outcomes  Patient wishes to get back to work and to be able to work without any health problems.   Patient wishes to get back to work and to be able to work without any health problems.   Patient wishes to get back to work and to be able to work without any health problems.          Discharge Exercise Prescription (Final Exercise Prescription Changes): Exercise Prescription Changes - 10/08/17 1400      Response to Exercise   Blood Pressure (Admit)  110/70    Blood Pressure (Exercise)  130/70    Blood Pressure (Exit)  100/60    Heart Rate (Admit)  60 bpm    Heart Rate (Exercise)  101 bpm    Heart Rate (Exit)  68 bpm    Rating of Perceived Exertion (Exercise)  11    Duration  Progress to 30 minutes of  aerobic without signs/symptoms of physical distress    Intensity  THRR New [id number]      Progression   Progression  Continue to progress workloads to maintain intensity without signs/symptoms of physical distress.      Resistance Training   Training Prescription  Yes    Weight  8    Reps  10-15      Treadmill   MPH  3.2    Grade  0    Minutes  20     METs  3.44      Elliptical   Level  1    Speed  43    Minutes  15    METs  3.1       Nutrition:  Target Goals: Understanding of nutrition guidelines, daily intake of sodium <1527m, cholesterol <2036m calories 30% from fat and 7% or less from saturated fats, daily to have 5 or more servings of fruits and vegetables.  Biometrics: Pre Biometrics - 07/22/17 1400      Pre Biometrics   Height  _0  (1.905 m)    Weight  212 lb 6.4 oz (96.3 kg)    Waist Circumference  37.5 inches    Hip Circumference  40 inches    Waist to Hip Ratio  0.94 %    BMI (Calculated)  26.55    Triceps Skinfold  6 mm    % Body Fat  21.3 %    Grip Strength  67.4 kg    Flexibility  18.83 in    Single Leg Stand  23 seconds      Post Biometrics - 10/20/17 1426       Post  Biometrics   Height  _1  (1.905 m)    Weight  202 lb 13.2 oz (92 kg)    Waist Circumference  35.5 inches    Hip Circumference  39 inches    Waist to Hip Ratio  0.91 %    BMI (Calculated)  25.35    Triceps Skinfold  5 mm    % Body Fat  19.2 %    Grip Strength  103.6 kg    Flexibility  17.5 in    Single Leg Stand  60 seconds       Nutrition Therapy Plan and Nutrition Goals: Nutrition Therapy & Goals - 08/12/17 1528      Personal Nutrition Goals   Nutrition Goal  For heart healthy choices add >50% of whole grains, make half their plate fruits and vegetables. Discuss the difference between starchy vegetables and leafy greens, and how  leafy vegetables provide fiber, helps maintain healthy weight, helps control blood glucose, and lowers cholesterol.  Discuss purchasing fresh or frozen vegetable to reduce sodium and not to add grease, fat or sugar. Consume <18oz of red meat per week. Consume lean cuts of meats and very little of meats high in sodium and nitrates such as pork and lunch meats. Discussed portion control for all food groups.      Personal Goal #2  Patient continues to eat heart healthy. No salt, non fat products, no  fired food, and lean meats. He plans to attend the RD appointment tomorrow.     Additional Goals?  No    Comments  Patient met with RD 08/12/17.      Intervention Plan   Intervention  Nutrition handout(s) given to patient.    Expected Outcomes  Short Term Goal: Understand basic principles of dietary content, such as calories, fat, sodium, cholesterol and nutrients.;Long Term Goal: Adherence to prescribed nutrition plan.       Nutrition Assessments: Nutrition Assessments - 10/20/17 1450      MEDFICTS Scores   Pre Score  6    Post Score  6    Score Difference  0       Nutrition Goals Re-Evaluation: Nutrition Goals Re-Evaluation    [place] Name 09/08/17 1316 09/28/17 0752           Goals   Current Weight  205 lb (93 kg)  202 lb 6.4 oz (91.8 kg)      Nutrition Goal  For heart healthy choices add >50% of whole grains, make half their plate fruits and vegetables. Discuss the difference between starchy vegetables and leafy greens, and how leafy vegetables provide fiber, helps maintain healthy weight, helps control blood glucose, and lowers cholesterol.  Discuss purchasing fresh or frozen vegetable to reduce sodium and not to add grease, fat or sugar. Consume <18oz of red meat per week. Consume lean cuts of meats and very little of meats high in sodium and nitrates such as pork and lunch meats. Discussed portion control for all food groups.    For heart healthy choices add >50% of whole grains, make half their plate fruits and vegetables. Discuss the difference between starchy vegetables and leafy greens, and how leafy vegetables provide fiber, helps maintain healthy weight, helps control blood glucose, and lowers cholesterol.  Discuss purchasing fresh or frozen vegetable to reduce sodium and not to add grease, fat or sugar. Consume <18oz of red meat per week. Consume lean cuts of meats and very little of meats high in sodium and nitrates such as pork and lunch meats. Discussed portion control for all  food groups.        Comment  Patient has lost 7.4 lbs since his initial visit. He says he is trying to eat healthy and eat smaller portions. Will continue to monitor for progress.   Patient has lost 3 lbs since last 30 day review. He says he continues to eat healthy and smaller portions. Will continue to monitor for progress.       Expected Outcome  Patient will continue to work toward meeting his nutritional goals.   Patient will continue to work toward meeting his nutritional goals.         Personal Goal #2 Re-Evaluation   Personal Goal #2  Patient continues to eat heart healthy. No salt, non fat products, no fired food, and lean meats. He plans to attend the RD appointment tomorrow.   Patient continues to  eat heart healthy. No salt, non fat products, no fired food, and lean meats. He plans to attend the RD appointment tomorrow.          Nutrition Goals Discharge (Final Nutrition Goals Re-Evaluation): Nutrition Goals Re-Evaluation - 09/28/17 0752      Goals   Current Weight  202 lb 6.4 oz (91.8 kg)    Nutrition Goal  For heart healthy choices add >50% of whole grains, make half their plate fruits and vegetables. Discuss the difference between starchy vegetables and leafy greens, and how leafy vegetables provide fiber, helps maintain healthy weight, helps control blood glucose, and lowers cholesterol.  Discuss purchasing fresh or frozen vegetable to reduce sodium and not to add grease, fat or sugar. Consume <18oz of red meat per week. Consume lean cuts of meats and very little of meats high in sodium and nitrates such as pork and lunch meats. Discussed portion control for all food groups.      Comment  Patient has lost 3 lbs since last 30 day review. He says he continues to eat healthy and smaller portions. Will continue to monitor for progress.     Expected Outcome  Patient will continue to work toward meeting his nutritional goals.       Personal Goal #2 Re-Evaluation   Personal Goal #2   Patient continues to eat heart healthy. No salt, non fat products, no fired food, and lean meats. He plans to attend the RD appointment tomorrow.        Psychosocial: Target Goals: Acknowledge presence or absence of significant depression and/or stress, maximize coping skills, provide positive support system. Participant is able to verbalize types and ability to use techniques and skills needed for reducing stress and depression.  Initial Review & Psychosocial Screening: Initial Psych Review & Screening - 07/22/17 1531      Initial Review   Current issues with  None Identified      Family Dynamics   Good Support System?  Yes      Barriers   Psychosocial barriers to participate in program  There are no identifiable barriers or psychosocial needs.      Screening Interventions   Interventions  Encouraged to exercise    Expected Outcomes  Short Term goal: Identification and review with participant of any Quality of Life or Depression concerns found by scoring the questionnaire.;Long Term goal: The participant improves quality of Life and PHQ9 Scores as seen by post scores and/or verbalization of changes       Quality of Life Scores: Quality of Life - 10/20/17 1427      Quality of Life Scores   Health/Function Pre  28.4 %    Health/Function Post  29.1 %    Health/Function % Change  2.46 %    Socioeconomic Pre  28.29 %    Socioeconomic Post  28.07 %    Socioeconomic % Change   -0.78 %    Psych/Spiritual Pre  30 %    Psych/Spiritual Post  30 %    Psych/Spiritual % Change  0 %    Family Pre  27.6 %    Family Post  30 %    Family % Change  8.7 %    GLOBAL Pre  28.59 %    GLOBAL Post  29.21 %    GLOBAL % Change  2.17 %      Scores of 19 and below usually indicate a poorer quality of life in these areas.  A difference of  2-3 points is a clinically meaningful difference.  A difference of 2-3 points in the total score of the Quality of Life Index has been associated with significant  improvement in overall quality of life, self-image, physical symptoms, and general health in studies assessing change in quality of life.  PHQ-9: Recent Review Flowsheet Data    Depression screen [facility] 2/9 10/20/2017 07/22/2017   Decreased Interest 0 0   Down, Depressed, Hopeless 0 0   PHQ - 2 Score 0 0   Altered sleeping 0 0   Tired, decreased energy 0 0   Change in appetite 0 0   Feeling bad or failure about yourself  0 0   Trouble concentrating 0 0   Moving slowly or fidgety/restless 0 0   Suicidal thoughts 0 0   PHQ-9 Score 0 0     Interpretation of Total Score  Total Score Depression Severity:  1-4 = Minimal depression, 5-9 = Mild depression, 10-14 = Moderate depression, 15-19 = Moderately severe depression, 20-27 = Severe depression   Psychosocial Evaluation and Intervention: Psychosocial Evaluation - 10/20/17 1502      Discharge Psychosocial Assessment & Intervention   Comments  Patient has no psychosocial issues identified at discharge. His PHQ-9 score did not change at 0. His exit QOL score improved by 2.17% at 29.21%.       Psychosocial Re-Evaluation: Psychosocial Re-Evaluation    Row Name 08/11/17 1316 09/08/17 1320 09/28/17 0758         Psychosocial Re-Evaluation   Current issues with  None Identified  None Identified  None Identified     Comments  Patient's initial QOL score was 28.59 and his PHQ-9 score was 0 with no psychosocial issues identified.   Patient's initial QOL score was 28.59 and his PHQ-9 score was 0 with no psychosocial issues identified.   Patient's initial QOL score was 28.59 and his PHQ-9 score was 0 with no psychosocial issues identified.      Expected Outcomes  Patient will have no psychosocial issues identified at discharge.   Patient will have no psychosocial issues identified at discharge.   Patient will have no psychosocial issues identified at discharge.      Interventions  Stress management education;Encouraged to attend Cardiac  Rehabilitation for the exercise;Relaxation education  Stress management education;Encouraged to attend Cardiac Rehabilitation for the exercise;Relaxation education  Stress management education;Encouraged to attend Cardiac Rehabilitation for the exercise;Relaxation education     Continue Psychosocial Services   No Follow up required  No Follow up required  No Follow up required        Psychosocial Discharge (Final Psychosocial Re-Evaluation): Psychosocial Re-Evaluation - 09/28/17 0758      Psychosocial Re-Evaluation   Current issues with  None Identified    Comments  Patient's initial QOL score was 28.59 and his PHQ-9 score was 0 with no psychosocial issues identified.     Expected Outcomes  Patient will have no psychosocial issues identified at discharge.     Interventions  Stress management education;Encouraged to attend Cardiac Rehabilitation for the exercise;Relaxation education    Continue Psychosocial Services   No Follow up required       Vocational Rehabilitation: Provide vocational rehab assistance to qualifying candidates.   Vocational Rehab Evaluation & Intervention: Vocational Rehab - 07/22/17 1516      Initial Vocational Rehab Evaluation & Intervention   Assessment shows need for Vocational Rehabilitation  No       Education: Education Goals: Education classes will be provided on  a weekly basis, covering required topics. Participant will state understanding/return demonstration of topics presented.  Learning Barriers/Preferences: Learning Barriers/Preferences - 07/22/17 1515      Learning Barriers/Preferences   Learning Barriers  Hearing    Learning Preferences  Skilled Demonstration;Individual Instruction;Group Instruction       Education Topics: Hypertension, Hypertension Reduction -Define heart disease and high blood pressure. Discus how high blood pressure affects the body and ways to reduce high blood pressure.   CARDIAC REHAB PHASE II EXERCISE from  10/13/2017 in [place]  Date  09/15/17  Educator  Diane coad  Instruction Review Code  2- Demonstrated Understanding      Exercise and Your Heart -Discuss why it is important to exercise, the FITT principles of exercise, normal and abnormal responses to exercise, and how to exercise safely.   CARDIAC REHAB PHASE II EXERCISE from 10/13/2017 in [place]  Date  09/22/17  Educator  [employer]  Instruction Review Code  2- Demonstrated Understanding      Angina -Discuss definition of angina, causes of angina, treatment of angina, and how to decrease risk of having angina.   CARDIAC REHAB PHASE II EXERCISE from 10/13/2017 in [place]  Date  09/29/17  Educator  [place]  Instruction Review Code  2- Demonstrated Understanding      Cardiac Medications -Review what the following cardiac medications are used for, how they affect the body, and side effects that may occur when taking the medications.  Medications include Aspirin, Beta blockers, calcium channel blockers, ACE Inhibitors, angiotensin receptor blockers, diuretics, digoxin, and antihyperlipidemics.   CARDIAC REHAB PHASE II EXERCISE from 10/13/2017 in [place]  Date  10/06/17  Educator  DC  Instruction Review Code  2- Demonstrated Understanding      Congestive Heart Failure -Discuss the definition of CHF, how to live with CHF, the signs and symptoms of CHF, and how keep track of weight and sodium intake.   CARDIAC REHAB PHASE II EXERCISE from 10/13/2017 in [place]  Date  10/13/17  Educator  DC  Instruction Review Code  2- Demonstrated Understanding      Heart Disease and Intimacy -Discus the effect sexual activity has on the heart, how changes occur during intimacy as we age, and safety during sexual activity.   Smoking Cessation / COPD -Discuss different methods to quit smoking, the health benefits of quitting smoking,  and the definition of COPD.   CARDIAC REHAB PHASE II EXERCISE from 10/13/2017 in [place]  Date  07/29/17  Educator  DC  Instruction Review Code  2- Demonstrated Understanding      Nutrition I: Fats -Discuss the types of cholesterol, what cholesterol does to the heart, and how cholesterol levels can be controlled.   CARDIAC REHAB PHASE II EXERCISE from 10/13/2017 in [place]  Date  08/04/17  Educator  DC  Instruction Review Code  2- Demonstrated Understanding      Nutrition II: Labels -Discuss the different components of food labels and how to read food label   CARDIAC REHAB PHASE II EXERCISE from 10/13/2017 in [place]  Date  08/11/17  Educator  [place]  Instruction Review Code  2- Demonstrated Understanding      Heart Parts/Heart Disease and PAD -Discuss the anatomy of the heart, the pathway of blood circulation through the heart, and these are affected by heart disease.   CARDIAC REHAB PHASE II EXERCISE from 10/13/2017  in [place]  Date  08/18/17  Educator  DJ  Instruction Review Code  2- Demonstrated Understanding      Stress I: Signs and Symptoms -Discuss the causes of stress, how stress may lead to anxiety and depression, and ways to limit stress.   CARDIAC REHAB PHASE II EXERCISE from 10/13/2017 in [place]  Date  08/25/17  Educator  [place]  Instruction Review Code  2- Demonstrated Understanding      Stress II: Relaxation -Discuss different types of relaxation techniques to limit stress.   CARDIAC REHAB PHASE II EXERCISE from 10/13/2017 in [place]  Date  09/01/17  Educator  [place]  Instruction Review Code  2- Demonstrated Understanding      Warning Signs of Stroke / TIA -Discuss definition of a stroke, what the signs and symptoms are of a stroke, and how to identify when someone is having stroke.   CARDIAC REHAB PHASE II EXERCISE  from 10/13/2017 in [place]  Date  09/08/17  Educator  DC  Instruction Review Code  2- Demonstrated Understanding      Knowledge Questionnaire Score: Knowledge Questionnaire Score - 10/20/17 1449      Knowledge Questionnaire Score   Pre Score  22/24    Post Score  20/24       Core Components/Risk Factors/Patient Goals at Admission: Personal Goals and Risk Factors at Admission - 07/22/17 1528      Core Components/Risk Factors/Patient Goals on Admission    Weight Management  Weight Maintenance    Personal Goal Other  Yes    Personal Goal  To get back to work.     Intervention  Attend CR program 3 x week and supplement with home exercise 2 x week.     Expected Outcomes  Reach personal goals.        Core Components/Risk Factors/Patient Goals Review:  Goals and Risk Factor Review    Row Name 08/11/17 1314 09/08/17 1318 09/28/17 0756 10/20/17 1452       Core Components/Risk Factors/Patient Goals Review   Personal Goals Review  Weight Management/Obesity Get back to work; work without any problems.   Weight Management/Obesity Get back to work; working without any problesm.   Weight Management/Obesity Get back to work; Working without problems.   Weight Management/Obesity Get back to work; working without any problems.    Review  Patient has completed 10 sessions losing 9.4 lbs since he started. He is doing well in the program with progression. He says he has cut out all sugar from his diet. He says he feels stronger and hopes to get stronger as he continues the program. Will continue to monitor.   Patient has complted 22 sessions losing 7.4 lbs since his initial visit. He continues to do well in the program with progression. He continues to say he feels stronger and feels like doing more around the house. He has more energy and feels more like he is able now to return to his job but plans to complete the program before he returns. Will continue to monitor for  progress.   Patient has completed 29 sessions losing 5 lbs since last 30 day review. He is very pleased with his progress in the program. He continues to do well with progression. He continues to say he feels stronger and feels like he is able to return to work now. He has more energy and stamina. He was recently hospitalized with a small bowel  obstruction which resolved on it's own with bowel rest. He is symptom free now. Will continue to monitor for progress.   Patient graduated with 36 sessions losing 10 lbs overall. He did very well in the program. His exit walk test improved by 24.14%. His exit measurements improved in balance; grip strength and flexibility. He says he is a lot stronger now and feels like he is able to return to work. He plans to return to work soon. He has more energy and stamina and hopes not to have any diffculty. He plans to continue exercising by walking and working with weights.     Expected Outcomes  Patient will continue to attend sessions and complete the program meeting his personal needs.   Patient will continue to attend sessions and complete the program meeting his personal needs.   Patient will continue to attend sessions and complete the program meeting his personal needs.   Patient will continue exercising by walking and working with weights and continue to meet his personal goals. CR will f/u for one year.        Core Components/Risk Factors/Patient Goals at Discharge (Final Review):  Goals and Risk Factor Review - 10/20/17 1452      Core Components/Risk Factors/Patient Goals Review   Personal Goals Review  Weight Management/Obesity Get back to work; working without any problems.    Review  Patient graduated with 36 sessions losing 10 lbs overall. He did very well in the program. His exit walk test improved by 24.14%. His exit measurements improved in balance; grip strength and flexibility. He says he is a lot stronger now and feels like he is able to return to work.  He plans to return to work soon. He has more energy and stamina and hopes not to have any diffculty. He plans to continue exercising by walking and working with weights.     Expected Outcomes  Patient will continue exercising by walking and working with weights and continue to meet his personal goals. CR will f/u for one year.        ITP Comments:   Comments: Patient graduated from [facility] today on 10/15/17 after completing 36 sessions. He achieved LTG of 30 minutes of aerobic exercise at Max Met level of 3.1. All patients vitals are WNL. Patient has met with dietician. Discharge instruction has been reviewed in detail and patient stated an understanding of material given. Patient plans to continue to exercise by walking and working with weights. Cardiac Rehab staff will make f/u calls at 1 month, 6 months, and 1 year. Patient had no complaints of any abnormal S/S or pain on their exit visit.

## 2017-10-20 NOTE — Progress Notes (Signed)
Discharge Progress Report  Patient Details  Name: Daniel Mcgee MRN: [id number] Date of Birth: 11-03-1960 Referring Provider:     CARDIAC REHAB PHASE II ORIENTATION from 07/22/2017 in [place]  Referring Provider  [name]        Number of Visits: 36  Reason for Discharge:  Patient reached a stable level of exercise. Patient independent in their exercise. Patient has met program and personal goals.  Smoking History:  Social History   Tobacco Use  Smoking Status Never Smoker  Smokeless Tobacco Former Systems developer  . Types: Chew    Diagnosis:  ST elevation myocardial infarction involving left circumflex coronary artery (HCC)  Status post coronary artery stent placement  ADL [facility]:   Initial Exercise Prescription: Initial Exercise Prescription - 07/22/17 1500      Elliptical   Level  --    Speed  --    Minutes  --    METs  --      Prescription Details   Frequency (times per week)  --    Duration  --      Intensity   THRR 40-80% of Max Heartrate  --    Ratings of Perceived Exertion  --    Perceived Dyspnea  --      Progression   Progression  --      Resistance Training   Training Prescription  --    Weight  --    Reps  --       Discharge Exercise Prescription (Final Exercise Prescription Changes): Exercise Prescription Changes - 10/08/17 1400      Response to Exercise   Blood Pressure (Admit)  110/70    Blood Pressure (Exercise)  130/70    Blood Pressure (Exit)  100/60    Heart Rate (Admit)  60 bpm    Heart Rate (Exercise)  101 bpm    Heart Rate (Exit)  68 bpm    Rating of Perceived Exertion (Exercise)  11    Duration  Progress to 30 minutes of  aerobic without signs/symptoms of physical distress    Intensity  THRR New [id number]      Progression   Progression  Continue to progress workloads to maintain intensity without signs/symptoms of physical distress.      Resistance Training   Training Prescription  Yes     Weight  8    Reps  10-15      Treadmill   MPH  3.2    Grade  0    Minutes  20    METs  3.44      Elliptical   Level  1    Speed  43    Minutes  15    METs  3.1       Functional Capacity: 6 Minute Walk    Row Name 07/22/17 1357 10/19/17 1531       6 Minute Walk   Phase  Initial  Discharge    Distance  1450 feet  1800 feet    Distance % Change  0 %  24.14 %    Distance Feet Change  0 ft  250 ft    Walk Time  6 minutes  6 minutes    # of Rest Breaks  0  0    MPH  2.74  3.4    METS  3.1  3.61    RPE  9  12    Perceived Dyspnea   8  11  VO2 Peak  15.8  16.48    Symptoms  No  No    Resting HR  78 bpm  79 bpm    Resting BP  122/82  110/70    Resting Oxygen Saturation   97 %  92 %    Exercise Oxygen Saturation  during 6 min walk  90 %  96 %    Max Ex. HR  120 bpm  96 bpm    Max Ex. BP  160/86  130/70    2 Minute Post BP  124/76  100/66       Psychological, QOL, Others - Outcomes: PHQ 2/9: Depression screen [facility] 2/9 10/20/2017 07/22/2017  Decreased Interest 0 0  Down, Depressed, Hopeless 0 0  PHQ - 2 Score 0 0  Altered sleeping 0 0  Tired, decreased energy 0 0  Change in appetite 0 0  Feeling bad or failure about yourself  0 0  Trouble concentrating 0 0  Moving slowly or fidgety/restless 0 0  Suicidal thoughts 0 0  PHQ-9 Score 0 0    Quality of Life: Quality of Life - 10/20/17 1427      Quality of Life Scores   Health/Function Pre  28.4 %    Health/Function Post  29.1 %    Health/Function % Change  2.46 %    Socioeconomic Pre  28.29 %    Socioeconomic Post  28.07 %    Socioeconomic % Change   -0.78 %    Psych/Spiritual Pre  30 %    Psych/Spiritual Post  30 %    Psych/Spiritual % Change  0 %    Family Pre  27.6 %    Family Post  30 %    Family % Change  8.7 %    GLOBAL Pre  28.59 %    GLOBAL Post  29.21 %    GLOBAL % Change  2.17 %       Personal Goals: Goals established at orientation with interventions provided to work toward goal. Personal  Goals and Risk Factors at Admission - 07/22/17 1528      Core Components/Risk Factors/Patient Goals on Admission    Weight Management  Weight Maintenance    Personal Goal Other  Yes    Personal Goal  To get back to work.     Intervention  Attend CR program 3 x week and supplement with home exercise 2 x week.     Expected Outcomes  Reach personal goals.         Personal Goals Discharge: Goals and Risk Factor Review    Row Name 08/11/17 1314 09/08/17 1318 09/28/17 0756 10/20/17 1452       Core Components/Risk Factors/Patient Goals Review   Personal Goals Review  Weight Management/Obesity Get back to work; work without any problems.   Weight Management/Obesity Get back to work; working without any problesm.   Weight Management/Obesity Get back to work; Working without problems.   Weight Management/Obesity Get back to work; working without any problems.    Review  Patient has completed 10 sessions losing 9.4 lbs since he started. He is doing well in the program with progression. He says he has cut out all sugar from his diet. He says he feels stronger and hopes to get stronger as he continues the program. Will continue to monitor.   Patient has complted 22 sessions losing 7.4 lbs since his initial visit. He continues to do well in the program with progression. He continues to  say he feels stronger and feels like doing more around the house. He has more energy and feels more like he is able now to return to his job but plans to complete the program before he returns. Will continue to monitor for progress.   Patient has completed 29 sessions losing 5 lbs since last 30 day review. He is very pleased with his progress in the program. He continues to do well with progression. He continues to say he feels stronger and feels like he is able to return to work now. He has more energy and stamina. He was recently hospitalized with a small bowel obstruction which resolved on it's own with bowel rest. He is  symptom free now. Will continue to monitor for progress.   Patient graduated with 36 sessions losing 10 lbs overall. He did very well in the program. His exit walk test improved by 24.14%. His exit measurements improved in balance; grip strength and flexibility. He says he is a lot stronger now and feels like he is able to return to work. He plans to return to work soon. He has more energy and stamina and hopes not to have any diffculty. He plans to continue exercising by walking and working with weights.     Expected Outcomes  Patient will continue to attend sessions and complete the program meeting his personal needs.   Patient will continue to attend sessions and complete the program meeting his personal needs.   Patient will continue to attend sessions and complete the program meeting his personal needs.   Patient will continue exercising by walking and working with weights and continue to meet his personal goals. CR will f/u for one year.        Exercise Goals and Review: Exercise Goals    Row Name 07/22/17 1359             Exercise Goals   Increase Physical Activity  Yes       Intervention  Provide advice, education, support and counseling about physical activity/exercise needs.;Develop an individualized exercise prescription for aerobic and resistive training based on initial evaluation findings, risk stratification, comorbidities and participant's personal goals.       Expected Outcomes  Short Term: Attend rehab on a regular basis to increase amount of physical activity.       Increase Strength and Stamina  Yes       Intervention  Develop an individualized exercise prescription for aerobic and resistive training based on initial evaluation findings, risk stratification, comorbidities and participant's personal goals.;Provide advice, education, support and counseling about physical activity/exercise needs.       Expected Outcomes  Short Term: Increase workloads from initial exercise  prescription for resistance, speed, and METs.       Able to understand and use rate of perceived exertion (RPE) scale  Yes       Intervention  Provide education and explanation on how to use RPE scale       Expected Outcomes  Short Term: Able to use RPE daily in rehab to express subjective intensity level;Long Term:  Able to use RPE to guide intensity level when exercising independently       Able to understand and use Dyspnea scale  Yes       Intervention  Provide education and explanation on how to use Dyspnea scale       Expected Outcomes  Short Term: Able to use Dyspnea scale daily in rehab to express subjective sense of shortness of  breath during exertion;Long Term: Able to use Dyspnea scale to guide intensity level when exercising independently       Knowledge and understanding of Target Heart Rate Range (THRR)  Yes       Intervention  Provide education and explanation of THRR including how the numbers were predicted and where they are located for reference       Expected Outcomes  Long Term: Able to use THRR to govern intensity when exercising independently;Short Term: Able to state/look up THRR;Short Term: Able to use daily as guideline for intensity in rehab       Able to check pulse independently  Yes       Intervention  Provide education and demonstration on how to check pulse in carotid and radial arteries.;Review the importance of being able to check your own pulse for safety during independent exercise       Expected Outcomes  Short Term: Able to explain why pulse checking is important during independent exercise;Long Term: Able to check pulse independently and accurately       Understanding of Exercise Prescription  Yes       Intervention  Provide education, explanation, and written materials on patient's individual exercise prescription       Expected Outcomes  Short Term: Able to explain program exercise prescription;Long Term: Able to explain home exercise prescription to exercise  independently          Nutrition & Weight - Outcomes: Pre Biometrics - 07/22/17 1400      Pre Biometrics   Height  '6\' 3"'$  (1.905 m)    Weight  212 lb 6.4 oz (96.3 kg)    Waist Circumference  37.5 inches    Hip Circumference  40 inches    Waist to Hip Ratio  0.94 %    BMI (Calculated)  26.55    Triceps Skinfold  6 mm    % Body Fat  21.3 %    Grip Strength  67.4 kg    Flexibility  18.83 in    Single Leg Stand  23 seconds      Post Biometrics - 10/20/17 1426       Post  Biometrics   Height  '6\' 3"'$  (1.905 m)    Weight  202 lb 13.2 oz (92 kg)    Waist Circumference  35.5 inches    Hip Circumference  39 inches    Waist to Hip Ratio  0.91 %    BMI (Calculated)  25.35    Triceps Skinfold  5 mm    % Body Fat  19.2 %    Grip Strength  103.6 kg    Flexibility  17.5 in    Single Leg Stand  60 seconds       Nutrition: Nutrition Therapy & Goals - 08/12/17 1528      Personal Nutrition Goals   Nutrition Goal  For heart healthy choices add >50% of whole grains, make half their plate fruits and vegetables. Discuss the difference between starchy vegetables and leafy greens, and how leafy vegetables provide fiber, helps maintain healthy weight, helps control blood glucose, and lowers cholesterol.  Discuss purchasing fresh or frozen vegetable to reduce sodium and not to add grease, fat or sugar. Consume <18oz of red meat per week. Consume lean cuts of meats and very little of meats high in sodium and nitrates such as pork and lunch meats. Discussed portion control for all food groups.      Personal Goal #2  Patient  continues to eat heart healthy. No salt, non fat products, no fired food, and lean meats. He plans to attend the RD appointment tomorrow.     Additional Goals?  No    Comments  Patient met with RD 08/12/17.      Intervention Plan   Intervention  Nutrition handout(s) given to patient.    Expected Outcomes  Short Term Goal: Understand basic principles of dietary content, such as  calories, fat, sodium, cholesterol and nutrients.;Long Term Goal: Adherence to prescribed nutrition plan.       Nutrition Discharge: Nutrition Assessments - 10/20/17 1450      MEDFICTS Scores   Pre Score  6    Post Score  6    Score Difference  0       Education Questionnaire Score: Knowledge Questionnaire Score - 10/20/17 1449      Knowledge Questionnaire Score   Pre Score  22/24    Post Score  20/24       Goals reviewed with patient; copy given to patient.

## 2017-11-01 ENCOUNTER — Encounter: Payer: Self-pay | Admitting: Cardiovascular Disease

## 2017-11-01 ENCOUNTER — Ambulatory Visit (INDEPENDENT_AMBULATORY_CARE_PROVIDER_SITE_OTHER): Payer: PRIVATE HEALTH INSURANCE | Admitting: Cardiovascular Disease

## 2017-11-01 VITALS — BP 104/76 | HR 64 | Ht 75.0 in | Wt 202.0 lb

## 2017-11-01 DIAGNOSIS — I252 Old myocardial infarction: Secondary | ICD-10-CM

## 2017-11-01 DIAGNOSIS — I25118 Atherosclerotic heart disease of native coronary artery with other forms of angina pectoris: Secondary | ICD-10-CM | POA: Diagnosis not present

## 2017-11-01 DIAGNOSIS — Z955 Presence of coronary angioplasty implant and graft: Secondary | ICD-10-CM | POA: Diagnosis not present

## 2017-11-01 DIAGNOSIS — E785 Hyperlipidemia, unspecified: Secondary | ICD-10-CM

## 2017-11-01 NOTE — Progress Notes (Signed)
SUBJECTIVE: The patient presents for routine follow-up.  I last saw him in March 2019. He sustained an acute STEMI of the inferolateral wall secondary to critical stenosis of the left circumflex, treated successfully with primary PCI using a drug-eluting stent on 06/22/17.  There was mild nonobstructive disease of the LAD and RCA.  Echocardiogram on 06/23/17 demonstrated normal left ventricular systolic function and regional wall motion, LVEF 60-65%, with grade 2 diastolic dysfunction.  Lipid panel 06/23/17: Total cholesterol 137, triglycerides 104, HDL 47, LDL 69.  The patient denies any symptoms of chest pain, palpitations, shortness of breath, lightheadedness, dizziness, leg swelling, orthopnea, PND, and syncope.  He completed cardiac rehabilitation and enjoyed the experience.  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  He is worried about a second heart attack.  His systolic blood pressure would sometimes get as high as 190 after exercising and rehabilitation.  He is also worried about a stroke if he returns to work.  He has to carry at least 70 pounds of equipment.    Social history: He is a Agricultural consultant for [employer].    Review of Systems: As per "subjective", otherwise negative.  Allergies  Allergen Reactions  . Hydrocodone Itching    Current Outpatient Medications  Medication Sig Dispense Refill  . aspirin EC 81 MG tablet Take 81 mg by mouth daily.    Marland Kitchen atorvastatin (LIPITOR) 80 MG tablet Take 1 tablet (80 mg total) by mouth daily at 6 PM. 90 tablet 1  . metoprolol tartrate (LOPRESSOR) 25 MG tablet Take 0.5 tablets (12.5 mg total) by mouth 2 (two) times daily. 90 tablet 1  . nitroGLYCERIN (NITROSTAT) 0.4 MG SL tablet Place 1 tablet (0.4 mg total) under the tongue every 5 (five) minutes x 3 doses as needed for  chest pain. 25 tablet 2  . ranitidine (ZANTAC) 150 MG tablet Take 150 mg by mouth daily.    . ticagrelor (BRILINTA) 90 MG TABS tablet Take 1 tablet (90 mg total) by mouth 2 (two) times daily. 180 tablet 2   No current facility-administered medications for this visit.     Past Medical History:  Diagnosis Date  . Family history of premature CAD   . HLD (hyperlipidemia)   . STEMI (ST elevation myocardial infarction) ([place])    2/19 PCI/DES x1 to Lcx, mild disease in RCA, LAD, normal EF    Past Surgical History:  Procedure Laterality Date  . CORONARY STENT PLACEMENT    . CORONARY/GRAFT ACUTE MI REVASCULARIZATION N/A 06/22/2017   Procedure: Coronary/Graft Acute MI Revascularization;  Surgeon: Sherren Mocha, MD;  Location: [facility];  Service: Cardiovascular;  Laterality: N/A;  . INGUINAL HERNIA REPAIR Right 2016  . LEFT HEART CATH AND CORONARY ANGIOGRAPHY N/A 06/22/2017   Procedure: LEFT HEART CATH AND CORONARY ANGIOGRAPHY;  Surgeon: Sherren Mocha, MD;  Location: [facility];  Service: Cardiovascular;  Laterality: N/A;    Social History   Socioeconomic History  . Marital status: Married    Spouse name: Not on file  . Number of children: Not on file  . Years of education: Not on file  . Highest education level: Not on file  Occupational History  . Occupation: Teacher, English as a foreign language: [employer]  .  Financial resource strain: Not on file  . Food insecurity:    Worry: Not on file    Inability: Not on file  . Transportation needs:    Medical: Not on file    Non-medical: Not on file  Tobacco Use  . Smoking status: Never Smoker  . Smokeless tobacco: Former Systems developer    Types: Chew  Substance and Sexual Activity  . Alcohol use: Yes    Alcohol/week: 0.6 oz    Types: 1 Cans of beer per week    Comment: occasionally   . Drug use: No  . Sexual activity: Not on file  Lifestyle  . Physical activity:    Days per week: Not on file    Minutes per  session: Not on file  . Stress: Not on file  Relationships  . Social connections:    Talks on phone: Not on file    Gets together: Not on file    Attends religious service: Not on file    Active member of club or organization: Not on file    Attends meetings of clubs or organizations: Not on file    Relationship status: Not on file  . Intimate partner violence:    Fear of current or ex partner: Not on file    Emotionally abused: Not on file    Physically abused: Not on file    Forced sexual activity: Not on file  Other Topics Concern  . Not on file  Social History Narrative  . Not on file     Vitals:   11/01/17 1316  BP: 104/76  Pulse: 64  SpO2: 97%  Weight: 202 lb (91.6 kg)  Height: 6\' 3"  (1.905 m)    Wt Readings from Last 3 Encounters:  11/01/17 202 lb (91.6 kg)  10/20/17 202 lb 13.2 oz (92 kg)  09/28/17 201 lb (91.2 kg)     PHYSICAL EXAM General: NAD HEENT: Normal. Neck: No JVD, no thyromegaly. Lungs: Clear to auscultation bilaterally with normal respiratory effort. CV: Regular rate and rhythm, normal S1/S2, no S3/S4, no murmur. No pretibial or periankle edema.  No carotid bruit.   Abdomen: Soft, nontender, no distention.  Neurologic: Alert and oriented.  Psych: Normal affect. Skin: Normal. Musculoskeletal: No gross deformities.    ECG: Most recent ECG reviewed.   Labs: Lab Results  Component Value Date/Time   K 4.0 09/17/2017 04:36 AM   BUN 11 09/17/2017 04:36 AM   CREATININE 0.93 09/17/2017 04:36 AM   ALT 47 09/16/2017 09:44 AM   HGB 14.3 09/17/2017 04:36 AM     Lipids: Lab Results  Component Value Date/Time   LDLCALC 69 06/23/2017 02:41 AM   CHOL 137 06/23/2017 02:41 AM   TRIG 104 06/23/2017 02:41 AM   HDL 47 06/23/2017 02:41 AM       ASSESSMENT AND PLAN: 1.  Coronary disease with inferolateral STEMI status post drug-eluting stent placement to the left circumflex: Symptomatically stable.  Continue dual antiplatelet therapy for minimum  of 1 year with aspirin and Brilinta 90 mg's twice daily.  Continue metoprolol tartrate 12.5 mg twice daily and Lipitor 80 mg. I will provide him with a letter to keep him out of work for another 3 months.  We talked about applying for disability.  2.  Hyperlipidemia: Lipids reviewed above.  Continue high intensity statin therapy with Lipitor 80 mg. He did have diffuse joint pains in the past with Lipitor, Crestor, and Zocor.  He said it takes several years for the symptoms  to develop.  Should he develop them in the future, I would consider pitavastatin.      Disposition: Follow up 6 months  Time: 25 mins   Kate Sable, M.D., F.A.C.C.

## 2017-11-01 NOTE — Patient Instructions (Addendum)
Your physician wants you to follow-up in: 6 months with Dr.Koneswaran You will receive a reminder letter in the mail two months in advance. If you don't receive a letter, please call our office to schedule the follow-up appointment.   Your physician recommends that you continue on your current medications as directed. Please refer to the Current Medication list given to you today.    If you need a refill on your cardiac medications before your next appointment, please call your pharmacy.     No lab work or tests ordered today.     Thank you for choosing [facility] !         

## 2017-11-06 ENCOUNTER — Other Ambulatory Visit: Payer: Self-pay | Admitting: Cardiovascular Disease

## 2017-12-20 ENCOUNTER — Other Ambulatory Visit: Payer: Self-pay | Admitting: Cardiology

## 2018-01-31 ENCOUNTER — Telehealth: Payer: Self-pay | Admitting: Cardiovascular Disease

## 2018-01-31 NOTE — Telephone Encounter (Signed)
Patient is requesting letter to remain out or work until f/u in December / tg

## 2018-01-31 NOTE — Telephone Encounter (Signed)
Letter printed for pt, will pick up this week

## 2018-01-31 NOTE — Telephone Encounter (Signed)
Pt was given work slip to be out of work through 02/07/18. 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 He has the leave to cover him. He has applied for disability but the wait is 4-6 months for outcome.He asks that we extend work note through December

## 2018-01-31 NOTE — Telephone Encounter (Signed)
That would be fine 

## 2018-03-14 ENCOUNTER — Encounter: Payer: Self-pay | Admitting: *Deleted

## 2018-03-14 ENCOUNTER — Telehealth: Payer: Self-pay | Admitting: Cardiovascular Disease

## 2018-03-14 NOTE — Telephone Encounter (Signed)
Patient advised that work note will be extended until his next evaluation with Dr. Bronson Ing.

## 2018-03-14 NOTE — Telephone Encounter (Signed)
Patient states that he has work note for him to  Be out of work until 05/10/18. No availability for Dr.Koneswaran until 05/25/18. Patient wants to know if he can get another note or does he need to return to work. / tg

## 2018-03-14 NOTE — Telephone Encounter (Signed)
Ok to extend work note 

## 2018-03-21 ENCOUNTER — Other Ambulatory Visit: Payer: Self-pay | Admitting: Cardiovascular Disease

## 2018-05-05 ENCOUNTER — Other Ambulatory Visit: Payer: Self-pay | Admitting: Cardiovascular Disease

## 2018-05-25 ENCOUNTER — Encounter

## 2018-05-25 ENCOUNTER — Encounter: Payer: Self-pay | Admitting: Cardiovascular Disease

## 2018-05-25 ENCOUNTER — Telehealth: Payer: Self-pay | Admitting: Cardiovascular Disease

## 2018-05-25 ENCOUNTER — Ambulatory Visit (INDEPENDENT_AMBULATORY_CARE_PROVIDER_SITE_OTHER): Payer: PRIVATE HEALTH INSURANCE | Admitting: Cardiovascular Disease

## 2018-05-25 VITALS — BP 128/78 | HR 65 | Ht 75.0 in | Wt 215.0 lb

## 2018-05-25 DIAGNOSIS — I252 Old myocardial infarction: Secondary | ICD-10-CM | POA: Diagnosis not present

## 2018-05-25 DIAGNOSIS — E785 Hyperlipidemia, unspecified: Secondary | ICD-10-CM | POA: Diagnosis not present

## 2018-05-25 DIAGNOSIS — I25118 Atherosclerotic heart disease of native coronary artery with other forms of angina pectoris: Secondary | ICD-10-CM | POA: Diagnosis not present

## 2018-05-25 DIAGNOSIS — Z955 Presence of coronary angioplasty implant and graft: Secondary | ICD-10-CM | POA: Diagnosis not present

## 2018-05-25 MED ORDER — TICAGRELOR 90 MG PO TABS: 90.0000 mg | ORAL_TABLET | Freq: Two times a day (BID) | ORAL | 2 refills | Status: DC

## 2018-05-25 NOTE — Telephone Encounter (Signed)
Patient has questions regarding note out of workt / tg

## 2018-05-25 NOTE — Telephone Encounter (Signed)
Needed work slip to extend off work through 08/24/2018    Will fax to [place] office

## 2018-05-25 NOTE — Patient Instructions (Signed)
Medication Instructions:  Mid February, DECREASE Brilinta to 60 mg twice a day  If you need a refill on your cardiac medications before your next appointment, please call your pharmacy.   Lab work: None If you have labs (blood work) drawn today and your tests are completely normal, you will receive your results only by: Marland Kitchen MyChart Message (if you have MyChart) OR . A paper copy in the mail If you have any lab test that is abnormal or we need to change your treatment, we will call you to review the results.  Testing/Procedures: NONE  Follow-Up: At [facility], you and your health needs are our priority.  As part of our continuing mission to provide you with exceptional heart care, we have created designated Provider Care Teams.  These Care Teams include your primary Cardiologist (physician) and Advanced Practice Providers (APPs -  Physician Assistants and Nurse Practitioners) who all work together to provide you with the care you need, when you need it. You will need a follow up appointment in 6 months.  Please call our office 2 months in advance to schedule this appointment.  You may see Kate Sable, MD or one of the following Advanced Practice Providers on your designated Care Team:   Bernerd Pho, PA-C [facility]) . Ermalinda Barrios, PA-C ([facility])  Any Other Special Instructions Will Be Listed Below (If Applicable). None

## 2018-05-25 NOTE — Progress Notes (Signed)
SUBJECTIVE: The patient presents for routine follow-up. He sustained an acute STEMI of the inferolateral wall secondary to critical stenosis of the left circumflex, treated successfully with primary PCI using a drug-eluting stent on 06/22/17. There was mild nonobstructive disease of the LAD and RCA.  Echocardiogram on 06/23/17 demonstrated normal left ventricular systolic function and regional wall motion, LVEF 60-65%, with grade 2 diastolic dysfunction.  Lipid panel 06/23/17: Total cholesterol 137, triglycerides 104, HDL 47, LDL 69.  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  He often works at [employer] alone and is worried about a second heart attack.  He is here with his wife and grandson.  The patient denies any symptoms of chest pain, palpitations, shortness of breath, lightheadedness, dizziness, leg swelling, orthopnea, PND, and syncope.  He uses the treadmill and elliptical 4 times per week without difficulty.  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  He requests a note excusing him from the next 3 months of work until all his paperwork can be finalized.  He has a strong faith.    Review of Systems: As per "subjective", otherwise negative.  Allergies  Allergen Reactions  . Hydrocodone Itching    Current Outpatient Medications  Medication Sig Dispense Refill  . aspirin EC 81 MG tablet Take 81 mg by mouth daily.    Marland Kitchen atorvastatin (LIPITOR) 80 MG tablet TAKE ONE TABLET BY MOUTH DAILY AT 6 PM 90 tablet 1  . BRILINTA 90 MG TABS tablet TAKE ONE TABLET BY MOUTH TWICE DAILY. 180 tablet 2  . metoprolol tartrate (LOPRESSOR) 25 MG tablet TAKE 1/2 TABLET BY MOUTH TWICE DAILY. 90 tablet 1  . nitroGLYCERIN (NITROSTAT) 0.4 MG SL tablet Place 1 tablet (0.4 mg total) under the tongue every 5 (five) minutes x 3 doses as needed for chest pain. 25  tablet 2   No current facility-administered medications for this visit.     Past Medical History:  Diagnosis Date  . Family history of premature CAD   . HLD (hyperlipidemia)   . STEMI (ST elevation myocardial infarction) ([place])    2/19 PCI/DES x1 to Lcx, mild disease in RCA, LAD, normal EF    Past Surgical History:  Procedure Laterality Date  . CORONARY STENT PLACEMENT    . CORONARY/GRAFT ACUTE MI REVASCULARIZATION N/A 06/22/2017   Procedure: Coronary/Graft Acute MI Revascularization;  Surgeon: Sherren Mocha, MD;  Location: [facility];  Service: Cardiovascular;  Laterality: N/A;  . INGUINAL HERNIA REPAIR Right 2016  . LEFT HEART CATH AND CORONARY ANGIOGRAPHY N/A 06/22/2017   Procedure: LEFT HEART CATH AND CORONARY ANGIOGRAPHY;  Surgeon: Sherren Mocha, MD;  Location: [facility];  Service: Cardiovascular;  Laterality: N/A;    Social History   Socioeconomic History  . Marital status: Married    Spouse name: Not on file  . Number of children: Not on file  . Years of education: Not on file  . Highest education level: Not on file  Occupational History  . Occupation: Teacher, English as a foreign language: [employer]  . Financial resource strain: Not on file  . Food insecurity:    Worry: Not on file    Inability: Not on file  . Transportation needs:    Medical: Not on file    Non-medical: Not  on file  Tobacco Use  . Smoking status: Never Smoker  . Smokeless tobacco: Former Systems developer    Types: Chew  Substance and Sexual Activity  . Alcohol use: Yes    Alcohol/week: 1.0 standard drinks    Types: 1 Cans of beer per week    Comment: occasionally   . Drug use: No  . Sexual activity: Not on file  Lifestyle  . Physical activity:    Days per week: Not on file    Minutes per session: Not on file  . Stress: Not on file  Relationships  . Social connections:    Talks on phone: Not on file    Gets together: Not on file    Attends religious service: Not on file      Active member of club or organization: Not on file    Attends meetings of clubs or organizations: Not on file    Relationship status: Not on file  . Intimate partner violence:    Fear of current or ex partner: Not on file    Emotionally abused: Not on file    Physically abused: Not on file    Forced sexual activity: Not on file  Other Topics Concern  . Not on file  Social History Narrative  . Not on file     Vitals:   05/25/18 1126  BP: 128/78  Pulse: 65  SpO2: 98%  Weight: 215 lb (97.5 kg)  Height: 6\' 3"  (1.905 m)    Wt Readings from Last 3 Encounters:  05/25/18 215 lb (97.5 kg)  11/01/17 202 lb (91.6 kg)  10/20/17 202 lb 13.2 oz (92 kg)     PHYSICAL EXAM General: NAD HEENT: Normal. Neck: No JVD, no thyromegaly. Lungs: Clear to auscultation bilaterally with normal respiratory effort. CV: Regular rate and rhythm, normal S1/S2, no S3/S4, no murmur. No pretibial or periankle edema.  No carotid bruit.   Abdomen: Soft, nontender, no distention.  Neurologic: Alert and oriented.  Psych: Normal affect. Skin: Normal. Musculoskeletal: No gross deformities.    ECG: Reviewed above under Subjective   Labs: Lab Results  Component Value Date/Time   K 4.0 09/17/2017 04:36 AM   BUN 11 09/17/2017 04:36 AM   CREATININE 0.93 09/17/2017 04:36 AM   ALT 47 09/16/2017 09:44 AM   HGB 14.3 09/17/2017 04:36 AM     Lipids: Lab Results  Component Value Date/Time   LDLCALC 69 06/23/2017 02:41 AM   CHOL 137 06/23/2017 02:41 AM   TRIG 104 06/23/2017 02:41 AM   HDL 47 06/23/2017 02:41 AM       ASSESSMENT AND PLAN: 1.  Coronary disease with inferolateral STEMI status post drug-eluting stent placement to the left circumflex: Symptomatically stable.  Continue dual antiplatelet therapy for minimum of 1 year with aspirin and Brilinta 90 mg twice daily.  I will reduce Brilinta to 60 mg twice daily in mid February 2020. Continue metoprolol tartrate 12.5 mg twice daily and Lipitor 80  mg.  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  2.  Hyperlipidemia: Lipids reviewed above.  Continue high intensity statin therapy with Lipitor 80 mg.  He denies joint pains and myalgias.  He did have diffuse joint pains in the past with Lipitor, Crestor, and Zocor. He said it takes several years for the symptoms to develop. Should he develop them in the future, I would consider pitavastatin or Repatha.    Disposition: Follow up 6  months   Kate Sable, M.D., F.A.C.C.

## 2018-06-20 ENCOUNTER — Telehealth: Payer: Self-pay | Admitting: Cardiovascular Disease

## 2018-06-20 MED ORDER — NITROGLYCERIN 0.4 MG SL SUBL: 0.4000 mg | SUBLINGUAL_TABLET | SUBLINGUAL | 3 refills | Status: AC | PRN

## 2018-06-20 MED ORDER — TICAGRELOR 60 MG PO TABS: 60.0000 mg | ORAL_TABLET | Freq: Two times a day (BID) | ORAL | 3 refills | Status: DC

## 2018-06-20 NOTE — Telephone Encounter (Signed)
Per Dr. Arnold Long on 05/25/18 - Coronary disease with inferolateral STEMI status post drug-eluting stent placement to the left circumflex: Symptomatically stable. Continue dual antiplatelet therapy for minimum of 1 year with aspirin and Brilinta 90 mg twice daily.  I will reduce Brilinta to 60 mg twice daily in mid February 2020.Continue metoprolol tartrate 12.5 mg twice daily and Lipitor 80 mg.  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  Brilinta 60mg  twice a day  & Ntg refilled as requested.

## 2018-06-20 NOTE — Telephone Encounter (Signed)
Patient needs refill on Brilinta (states it is a lower dose) and refill on NTG sent to [facility] in [place]. / tg

## 2018-07-21 ENCOUNTER — Telehealth: Payer: Self-pay | Admitting: Cardiovascular Disease

## 2018-07-21 NOTE — Telephone Encounter (Signed)
Pt is needing a letter from Dr. Bronson Ing to give to his lawyer to help with his disability.   Requesting that Rowland please give him a call at her convenience

## 2018-07-22 NOTE — Telephone Encounter (Signed)
If you use my note to specify his cardiac conditions, that should be good enough.

## 2018-07-22 NOTE — Telephone Encounter (Signed)
What specifically does the letter need to state? We can assist him.

## 2018-07-22 NOTE — Telephone Encounter (Signed)
Spoke with pt. He states that he needs a letter from you stating the medical facts that keep him form returning to work. He states that his lawyer claims it has to be "detail oriented & specific."

## 2018-08-02 NOTE — Telephone Encounter (Signed)
Looked through note. I did  Not see anything that was stated specific enough to write in a note that he could not return to work other than his fears of a repeat MI. I am not sure that is going to be enough from a cardiac standpoint. Could you please write note to be sure that it is  specific enough to help patient. Will forward to Dr. Bronson Ing.

## 2018-08-02 NOTE — Telephone Encounter (Signed)
Letter written mailed to pt.

## 2018-08-02 NOTE — Telephone Encounter (Signed)
It can say something to the effect of, "The patient previously sustained an acute MI which has led to some degree of posttraumatic stress.  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

## 2018-11-03 ENCOUNTER — Other Ambulatory Visit: Payer: Self-pay | Admitting: Cardiovascular Disease

## 2018-12-26 ENCOUNTER — Encounter: Payer: Self-pay | Admitting: Cardiovascular Disease

## 2018-12-26 ENCOUNTER — Ambulatory Visit (INDEPENDENT_AMBULATORY_CARE_PROVIDER_SITE_OTHER): Payer: PRIVATE HEALTH INSURANCE | Admitting: Cardiovascular Disease

## 2018-12-26 ENCOUNTER — Encounter: Payer: Self-pay | Admitting: *Deleted

## 2018-12-26 ENCOUNTER — Other Ambulatory Visit: Payer: Self-pay

## 2018-12-26 VITALS — BP 132/80 | HR 63 | Temp 97.8°F | Ht 75.0 in | Wt 223.0 lb

## 2018-12-26 DIAGNOSIS — E785 Hyperlipidemia, unspecified: Secondary | ICD-10-CM

## 2018-12-26 DIAGNOSIS — I25118 Atherosclerotic heart disease of native coronary artery with other forms of angina pectoris: Secondary | ICD-10-CM | POA: Diagnosis not present

## 2018-12-26 DIAGNOSIS — I252 Old myocardial infarction: Secondary | ICD-10-CM | POA: Diagnosis not present

## 2018-12-26 DIAGNOSIS — Z955 Presence of coronary angioplasty implant and graft: Secondary | ICD-10-CM | POA: Diagnosis not present

## 2018-12-26 NOTE — Progress Notes (Signed)
SUBJECTIVE: The patient presents for routine follow-up. He sustained an acute STEMI of the inferolateral wall secondary to critical stenosis of the left circumflex, treated successfully with primary PCI using a drug-eluting stent on 06/22/17. There was mild nonobstructive disease of the LAD and RCA.  Echocardiogram on 06/23/17 demonstrated normal left ventricular systolic function and regional wall motion, LVEF 60-65%, with grade 2 diastolic dysfunction.  He retired from working as a Agricultural consultant.  He walks about 4 miles a day.  When he walks uphill he has some shortness of breath and has to take a couple of minutes to recover.  He denies exertional chest pain.  He is in the process of applying for disability.  His sister, Carter Kitten, is also my patient.    Review of Systems: As per "subjective", otherwise negative.  Allergies  Allergen Reactions  . Hydrocodone Itching    Current Outpatient Medications  Medication Sig Dispense Refill  . aspirin EC 81 MG tablet Take 81 mg by mouth daily.    Marland Kitchen atorvastatin (LIPITOR) 80 MG tablet TAKE ONE TABLET BY MOUTH DAILY AT 6 PM 90 tablet 1  . metoprolol tartrate (LOPRESSOR) 25 MG tablet TAKE 1/2 TABLET BY MOUTH TWICE DAILY. 90 tablet 1  . nitroGLYCERIN (NITROSTAT) 0.4 MG SL tablet Place 1 tablet (0.4 mg total) under the tongue every 5 (five) minutes x 3 doses as needed for chest pain. 25 tablet 3  . ticagrelor (BRILINTA) 60 MG TABS tablet Take 1 tablet (60 mg total) by mouth 2 (two) times daily. 180 tablet 3   No current facility-administered medications for this visit.     Past Medical History:  Diagnosis Date  . Family history of premature CAD   . HLD (hyperlipidemia)   . STEMI (ST elevation myocardial infarction) ([place])    2/19 PCI/DES x1 to Lcx, mild disease in RCA, LAD, normal EF    Past Surgical History:  Procedure Laterality Date  . CORONARY STENT PLACEMENT    . CORONARY/GRAFT ACUTE MI REVASCULARIZATION N/A 06/22/2017   Procedure: Coronary/Graft Acute MI Revascularization;  Surgeon: Sherren Mocha, MD;  Location: [facility];  Service: Cardiovascular;  Laterality: N/A;  . INGUINAL HERNIA REPAIR Right 2016  . LEFT HEART CATH AND CORONARY ANGIOGRAPHY N/A 06/22/2017   Procedure: LEFT HEART CATH AND CORONARY ANGIOGRAPHY;  Surgeon: Sherren Mocha, MD;  Location: [facility];  Service: Cardiovascular;  Laterality: N/A;    Social History   Socioeconomic History  . Marital status: Married    Spouse name: Not on file  . Number of children: Not on file  . Years of education: Not on file  . Highest education level: Not on file  Occupational History  . Occupation: Teacher, English as a foreign language: [employer]  . Financial resource strain: Not on file  . Food insecurity    Worry: Not on file    Inability: Not on file  . Transportation needs    Medical: Not on file    Non-medical: Not on file  Tobacco Use  . Smoking status: Never Smoker  . Smokeless tobacco: Former Systems developer    Types: Chew  Substance and Sexual Activity  . Alcohol use: Yes    Alcohol/week: 1.0 standard drinks    Types: 1 Cans of beer per week    Comment: occasionally   . Drug use: No  . Sexual activity: Not on file  Lifestyle  . Physical activity    Days  per week: Not on file    Minutes per session: Not on file  . Stress: Not on file  Relationships  . Social Herbalist on phone: Not on file    Gets together: Not on file    Attends religious service: Not on file    Active member of club or organization: Not on file    Attends meetings of clubs or organizations: Not on file    Relationship status: Not on file  . Intimate partner violence    Fear of current or ex partner: Not on file    Emotionally abused: Not on file    Physically abused: Not on file    Forced sexual activity: Not on file  Other Topics Concern  . Not on file  Social History Narrative  . Not on file     Vitals:   12/26/18 1558   BP: 132/80  Pulse: 63  Temp: 97.8 F (36.6 C)  SpO2: 98%  Weight: 223 lb (101.2 kg)  Height: 6\' 3"  (1.905 m)    Wt Readings from Last 3 Encounters:  12/26/18 223 lb (101.2 kg)  05/25/18 215 lb (97.5 kg)  11/01/17 202 lb (91.6 kg)     PHYSICAL EXAM General: NAD HEENT: Normal. Neck: No JVD, no thyromegaly. Lungs: Clear to auscultation bilaterally with normal respiratory effort. CV: Regular rate and rhythm, normal S1/S2, no S3/S4, no murmur. No pretibial or periankle edema.  No carotid bruit.   Abdomen: Soft, nontender, no distention.  Neurologic: Alert and oriented.  Psych: Normal affect. Skin: Normal. Musculoskeletal: No gross deformities.    ECG: Sinus bradycardia, 57 bpm, no ischemic ST segment or T wave abnormalities (personally reviewed)   Labs: Lab Results  Component Value Date/Time   K 4.0 09/17/2017 04:36 AM   BUN 11 09/17/2017 04:36 AM   CREATININE 0.93 09/17/2017 04:36 AM   ALT 47 09/16/2017 09:44 AM   HGB 14.3 09/17/2017 04:36 AM     Lipids: Lab Results  Component Value Date/Time   LDLCALC 69 06/23/2017 02:41 AM   CHOL 137 06/23/2017 02:41 AM   TRIG 104 06/23/2017 02:41 AM   HDL 47 06/23/2017 02:41 AM       ASSESSMENT AND PLAN: 1. Coronary artery disease: Status post pnferolateral STEMI status post drug-eluting stent placement to the left circumflex. Symptomatically stable. Continue aspirin.  He will stop Brilinta when he finishes his current bottle.Continue metoprolol tartrate 12.5 mg twice daily and Lipitor 80 mg.   2. Hyperlipidemia: I will obtain a copy of lipids from PCP. Continue high intensity statin therapy with Lipitor 80 mg.  He denies joint pains and myalgias.  He did have diffuse joint pains in the past with Lipitor, Crestor, and Zocor. He said it takes several years for the symptoms to develop. Should he develop them in the future, I would consider pitavastatin or Repatha.     Disposition: Follow up 6 months   Kate Sable, M.D., F.A.C.C.

## 2018-12-26 NOTE — Patient Instructions (Addendum)
Medication Instructions:   Your physician has recommended you make the following change in your medication:   Stop brilinta after you finish your current supply  Continue all other medications the same  Labwork:  NONE  Testing/Procedures:  NONE  Follow-Up:  Your physician recommends that you schedule a follow-up appointment in: 6 months. You will receive a reminder letter in the mail in about 4 months reminding you to call and schedule your appointment. If you don't receive this letter, please contact our office.  Any Other Special Instructions Will Be Listed Below (If Applicable).  If you need a refill on your cardiac medications before your next appointment, please call your pharmacy.

## 2019-05-01 ENCOUNTER — Other Ambulatory Visit: Payer: Self-pay | Admitting: Cardiovascular Disease

## 2019-08-29 ENCOUNTER — Other Ambulatory Visit: Payer: Self-pay | Admitting: Cardiovascular Disease

## 2019-09-05 ENCOUNTER — Other Ambulatory Visit: Payer: Self-pay

## 2019-09-05 ENCOUNTER — Encounter: Payer: Self-pay | Admitting: Cardiovascular Disease

## 2019-09-05 ENCOUNTER — Ambulatory Visit (INDEPENDENT_AMBULATORY_CARE_PROVIDER_SITE_OTHER): Payer: PRIVATE HEALTH INSURANCE | Admitting: Cardiovascular Disease

## 2019-09-05 VITALS — BP 118/82 | HR 61 | Ht 75.0 in | Wt 224.0 lb

## 2019-09-05 DIAGNOSIS — Z955 Presence of coronary angioplasty implant and graft: Secondary | ICD-10-CM

## 2019-09-05 DIAGNOSIS — I252 Old myocardial infarction: Secondary | ICD-10-CM | POA: Diagnosis not present

## 2019-09-05 DIAGNOSIS — I25118 Atherosclerotic heart disease of native coronary artery with other forms of angina pectoris: Secondary | ICD-10-CM

## 2019-09-05 DIAGNOSIS — E785 Hyperlipidemia, unspecified: Secondary | ICD-10-CM | POA: Diagnosis not present

## 2019-09-05 NOTE — Patient Instructions (Signed)

## 2019-09-05 NOTE — Progress Notes (Signed)
SUBJECTIVE: The patient presents for routine follow-up. He sustained an acute STEMI of the inferolateral wall secondary to critical stenosis of the left circumflex, treated successfully with primary PCI using a drug-eluting stent on 06/22/17. There was mild nonobstructive disease of the LAD and RCA.  Echocardiogram on 06/23/17 demonstrated normal left ventricular systolic function and regional wall motion, LVEF 60-65%, with grade 2 diastolic dysfunction.  He retired from working as a Agricultural consultant.  He has been walking 2 to 3 miles about 3 to 4 days/week.  The patient denies any symptoms of chest pain, palpitations, shortness of breath, lightheadedness, dizziness, leg swelling, orthopnea, PND, and syncope.  He told me his HbA1c is 6.4%.  He said his wife loves to bake.  His wife retired from working at Dr. [employer] office.  They help to take care of their 2 grandchildren starting at 730 every morning.  His sister, Carter Kitten, is also my patient.  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    Review of Systems: As per "subjective", otherwise negative.  Allergies  Allergen Reactions  . Hydrocodone Itching    Current Outpatient Medications  Medication Sig Dispense Refill  . aspirin EC 81 MG tablet Take 81 mg by mouth daily.    Marland Kitchen atorvastatin (LIPITOR) 80 MG tablet TAKE ONE TABLET BY MOUTH DAILY AT 6 PM 90 tablet 1  . metoprolol tartrate (LOPRESSOR) 25 MG tablet TAKE 1/2 TABLET BY MOUTH TWICE DAILY. **Need appt for further refills** 30 tablet 2  . nitroGLYCERIN (NITROSTAT) 0.4 MG SL tablet Place 1 tablet (0.4 mg total) under the tongue every 5 (five) minutes x 3 doses as needed for chest pain. 25 tablet 3   No current facility-administered medications for this visit.    Past Medical History:  Diagnosis Date  . Family history of premature CAD   . HLD (hyperlipidemia)   . STEMI (ST elevation myocardial infarction) ([place])    2/19 PCI/DES x1 to Lcx,  mild disease in RCA, LAD, normal EF    Past Surgical History:  Procedure Laterality Date  . CORONARY STENT PLACEMENT    . CORONARY/GRAFT ACUTE MI REVASCULARIZATION N/A 06/22/2017   Procedure: Coronary/Graft Acute MI Revascularization;  Surgeon: Sherren Mocha, MD;  Location: [facility];  Service: Cardiovascular;  Laterality: N/A;  . INGUINAL HERNIA REPAIR Right 2016  . LEFT HEART CATH AND CORONARY ANGIOGRAPHY N/A 06/22/2017   Procedure: LEFT HEART CATH AND CORONARY ANGIOGRAPHY;  Surgeon: Sherren Mocha, MD;  Location: [facility];  Service: Cardiovascular;  Laterality: N/A;    Social History   Socioeconomic History  . Marital status: Married    Spouse name: Not on file  . Number of children: Not on file  . Years of education: Not on file  . Highest education level: Not on file  Occupational History  . Occupation: Teacher, English as a foreign language: [employer]  Tobacco Use  . Smoking status: Never Smoker  . Smokeless tobacco: Former Systems developer    Types: Chew  Substance and Sexual Activity  . Alcohol use: Yes    Alcohol/week: 1.0 standard drinks    Types: 1 Cans of beer per week    Comment: occasionally   . Drug use: No  . Sexual activity: Not on file  Other Topics Concern  . Not on file  Social History Narrative  . Not on file   Social Determinants of Health   Financial Resource Strain:   .  Difficulty of Paying Living Expenses:   Food Insecurity:   . Worried About Charity fundraiser in the Last Year:   . Arboriculturist in the Last Year:   Transportation Needs:   . Film/video editor (Medical):   Marland Kitchen Lack of Transportation (Non-Medical):   Physical Activity:   . Days of Exercise per Week:   . Minutes of Exercise per Session:   Stress:   . Feeling of Stress :   Social Connections:   . Frequency of Communication with Friends and Family:   . Frequency of Social Gatherings with Friends and Family:   . Attends Religious Services:   . Active Member of Clubs or  Organizations:   . Attends Archivist Meetings:   Marland Kitchen Marital Status:   Intimate Partner Violence:   . Fear of Current or Ex-Partner:   . Emotionally Abused:   Marland Kitchen Physically Abused:   . Sexually Abused:      Vitals:   09/05/19 1422  BP: 118/82  Pulse: 61  SpO2: 98%  Weight: 224 lb (101.6 kg)  Height: 6\' 3"  (1.905 m)    Wt Readings from Last 3 Encounters:  09/05/19 224 lb (101.6 kg)  12/26/18 223 lb (101.2 kg)  05/25/18 215 lb (97.5 kg)     PHYSICAL EXAM General: NAD HEENT: Normal. Neck: No JVD, no thyromegaly. Lungs: Clear to auscultation bilaterally with normal respiratory effort. CV: Regular rate and rhythm, normal S1/S2, no S3/S4, no murmur. No pretibial or periankle edema.  No carotid bruit.   Abdomen: Soft, nontender, no distention.  Neurologic: Alert and oriented.  Psych: Normal affect. Skin: Normal. Musculoskeletal: No gross deformities.      Labs: Lab Results  Component Value Date/Time   K 4.0 09/17/2017 04:36 AM   BUN 11 09/17/2017 04:36 AM   CREATININE 0.93 09/17/2017 04:36 AM   ALT 47 09/16/2017 09:44 AM   HGB 14.3 09/17/2017 04:36 AM     Lipids: Lab Results  Component Value Date/Time   LDLCALC 69 06/23/2017 02:41 AM   CHOL 137 06/23/2017 02:41 AM   TRIG 104 06/23/2017 02:41 AM   HDL 47 06/23/2017 02:41 AM       ASSESSMENT AND PLAN: 1. Coronary artery disease: Status post inferolateral STEMI status post drug-eluting stent placement to the left circumflex on 06/23/2017. Symptomatically stable. Continue aspirin,  metoprolol tartrate 12.5 mg twice daily and Lipitor 80 mg.   2. Hyperlipidemia: Goal LDL less than 70. Continue high intensity statin therapy with Lipitor 80 mg.  He denies joint pains and myalgias.  He did have diffuse joint pains in the past with Lipitor, Crestor, and Zocor. He said it takes several years for the symptoms to develop. Should he develop them in the future, I would consider pitavastatin or  Repatha.     Disposition: Follow up 1 year  Kate Sable, M.D., F.A.C.C.

## 2019-10-26 ENCOUNTER — Other Ambulatory Visit: Payer: Self-pay | Admitting: Cardiovascular Disease

## 2020-02-05 DIAGNOSIS — E782 Mixed hyperlipidemia: Secondary | ICD-10-CM | POA: Diagnosis not present

## 2020-02-05 DIAGNOSIS — K219 Gastro-esophageal reflux disease without esophagitis: Secondary | ICD-10-CM | POA: Diagnosis not present

## 2020-02-05 DIAGNOSIS — R7309 Other abnormal glucose: Secondary | ICD-10-CM | POA: Diagnosis not present

## 2020-02-05 DIAGNOSIS — I10 Essential (primary) hypertension: Secondary | ICD-10-CM | POA: Diagnosis not present

## 2020-02-05 DIAGNOSIS — R946 Abnormal results of thyroid function studies: Secondary | ICD-10-CM | POA: Diagnosis not present

## 2020-02-08 DIAGNOSIS — E782 Mixed hyperlipidemia: Secondary | ICD-10-CM | POA: Diagnosis not present

## 2020-02-08 DIAGNOSIS — Z23 Encounter for immunization: Secondary | ICD-10-CM | POA: Diagnosis not present

## 2020-02-08 DIAGNOSIS — D171 Benign lipomatous neoplasm of skin and subcutaneous tissue of trunk: Secondary | ICD-10-CM | POA: Diagnosis not present

## 2020-02-08 DIAGNOSIS — Z1212 Encounter for screening for malignant neoplasm of rectum: Secondary | ICD-10-CM | POA: Diagnosis not present

## 2020-02-08 DIAGNOSIS — Z0001 Encounter for general adult medical examination with abnormal findings: Secondary | ICD-10-CM | POA: Diagnosis not present

## 2020-02-08 DIAGNOSIS — K7581 Nonalcoholic steatohepatitis (NASH): Secondary | ICD-10-CM | POA: Diagnosis not present

## 2020-02-08 DIAGNOSIS — I2510 Atherosclerotic heart disease of native coronary artery without angina pectoris: Secondary | ICD-10-CM | POA: Diagnosis not present

## 2020-02-10 IMAGING — DX DG ABDOMEN ACUTE W/ 1V CHEST
3 series · 3 of 3 positions shown · non-contrast
Comparison: 09/05/2013

CLINICAL DATA: Epigastric pain for 2 days, history of MI 06/22/2017

EXAM:
DG ABDOMEN ACUTE W/ 1V CHEST

[chest pa]
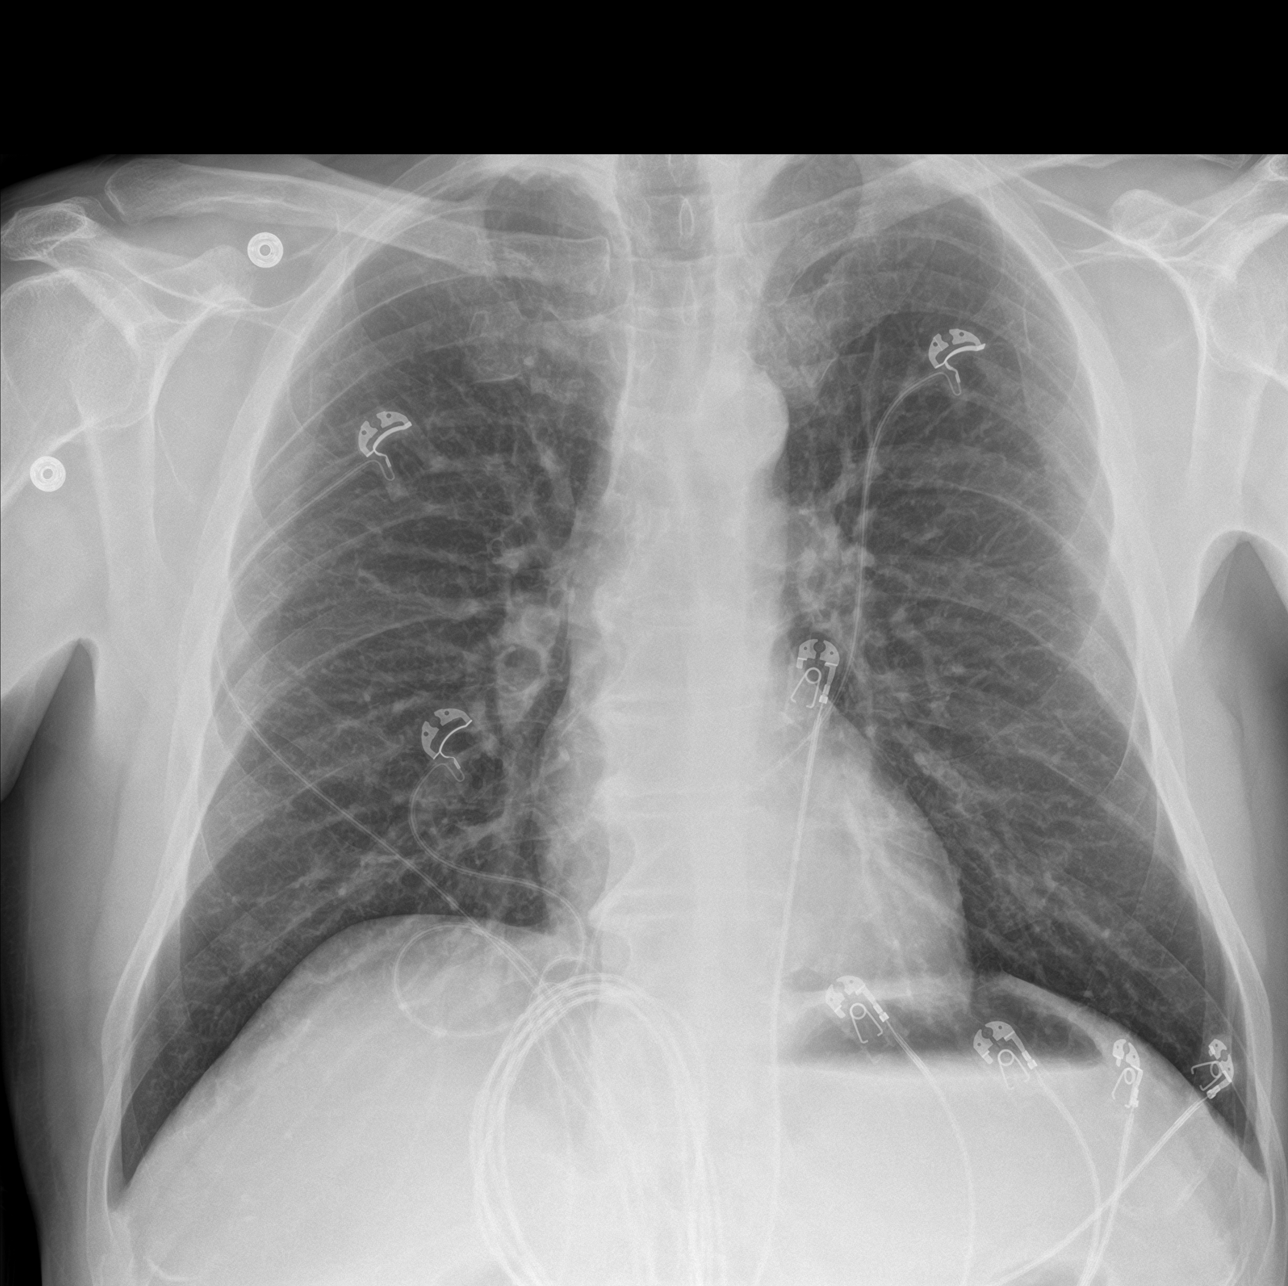

[abdomen erect]
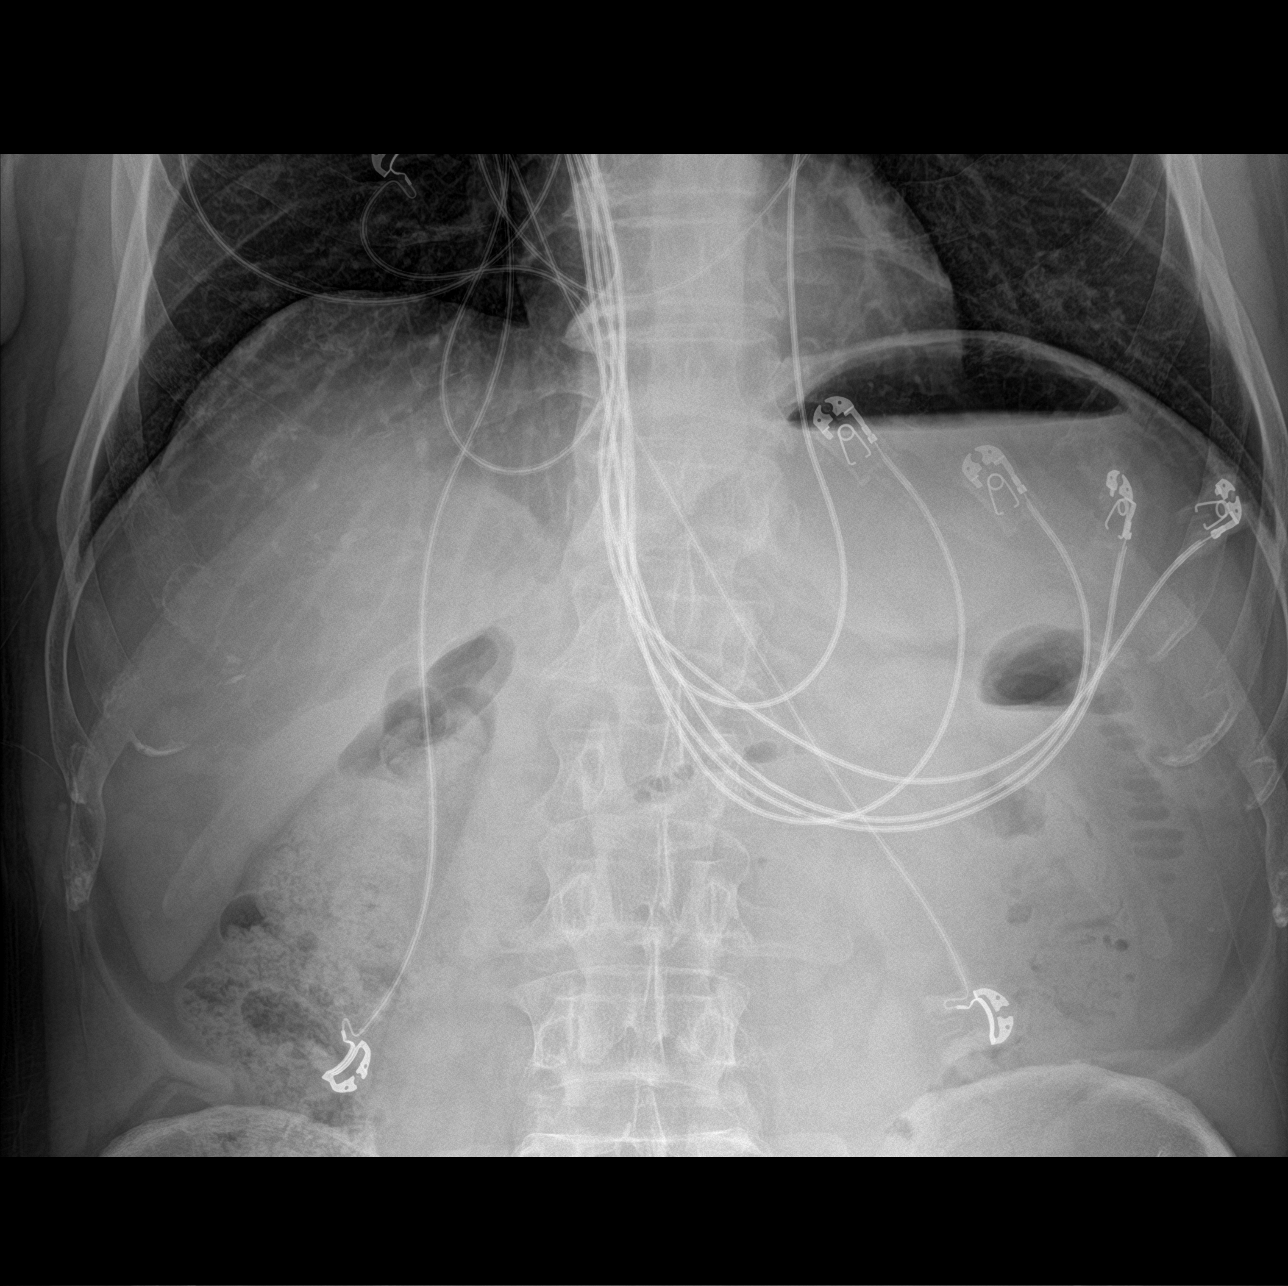

[abdomen supine]
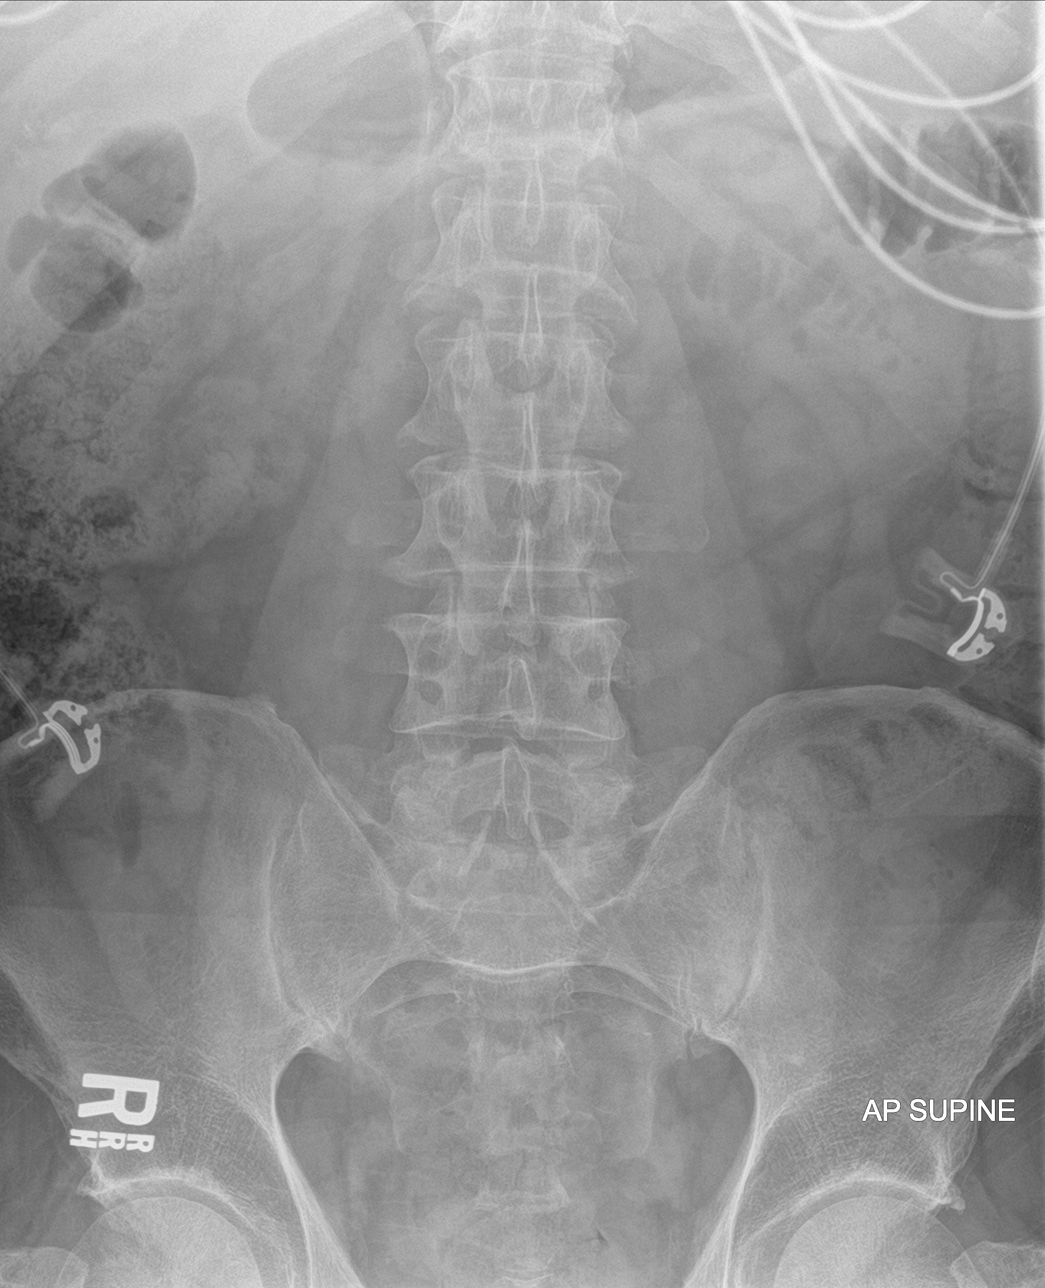

[3 of 3 positions shown; findings below may reference images not displayed]

FINDINGS: Normal heart size, mediastinal contours, and pulmonary vascularity.

Minimal bronchitic changes.

No acute infiltrate, pleural effusion or pneumothorax.

Bones unremarkable.

Nonobstructive bowel gas pattern.

Few nonspecific minimally prominent small bowel loops in the LEFT
upper quadrant.

Scattered stool in colon.

No bowel wall thickening, bowel dilatation, or free intraperitoneal
air.

No urinary tract calcification.
IMPRESSION: Bronchitic changes without infiltrate.

Nonobstructive bowel gas pattern with a few minimally prominent
small bowel loops in the LEFT upper quadrant, nonspecific, could
reflect mild ileus.

## 2020-02-20 DIAGNOSIS — R519 Headache, unspecified: Secondary | ICD-10-CM | POA: Diagnosis not present

## 2020-02-20 DIAGNOSIS — G43909 Migraine, unspecified, not intractable, without status migrainosus: Secondary | ICD-10-CM | POA: Diagnosis not present

## 2020-02-20 DIAGNOSIS — I672 Cerebral atherosclerosis: Secondary | ICD-10-CM | POA: Diagnosis not present

## 2020-02-23 DIAGNOSIS — K219 Gastro-esophageal reflux disease without esophagitis: Secondary | ICD-10-CM | POA: Diagnosis not present

## 2020-02-23 DIAGNOSIS — R519 Headache, unspecified: Secondary | ICD-10-CM | POA: Diagnosis not present

## 2020-02-23 DIAGNOSIS — E782 Mixed hyperlipidemia: Secondary | ICD-10-CM | POA: Diagnosis not present

## 2020-02-23 DIAGNOSIS — R7309 Other abnormal glucose: Secondary | ICD-10-CM | POA: Diagnosis not present

## 2020-02-23 DIAGNOSIS — I10 Essential (primary) hypertension: Secondary | ICD-10-CM | POA: Diagnosis not present

## 2020-02-26 DIAGNOSIS — G43009 Migraine without aura, not intractable, without status migrainosus: Secondary | ICD-10-CM | POA: Diagnosis not present

## 2020-02-29 DIAGNOSIS — I10 Essential (primary) hypertension: Secondary | ICD-10-CM | POA: Diagnosis not present

## 2020-02-29 DIAGNOSIS — E782 Mixed hyperlipidemia: Secondary | ICD-10-CM | POA: Diagnosis not present

## 2020-02-29 DIAGNOSIS — R7309 Other abnormal glucose: Secondary | ICD-10-CM | POA: Diagnosis not present

## 2020-02-29 DIAGNOSIS — K219 Gastro-esophageal reflux disease without esophagitis: Secondary | ICD-10-CM | POA: Diagnosis not present

## 2020-03-10 IMAGING — CT CT ENTEROGRAPHY (ABD-PELV W/ CM)
2 of 6 series · 16 of 46 positions shown, 18 images · IV contrast (Isovue)
Comparison: 09/16/2017 CT abdomen/pelvis.

CLINICAL DATA: Follow-up small bowel obstruction with bowel wall
thickening.

EXAM:
CT ABDOMEN AND PELVIS WITH CONTRAST (ENTEROGRAPHY)
TECHNIQUE: Multidetector CT of the abdomen and pelvis during bolus
administration of intravenous contrast. Negative oral contrast was
given.
CONTRAST:  100mL [id number] IOPAMIDOL ([id number]) INJECTION 61%

[Series 3: entero thins · axial · 0.76mm/px · z∈[-256,+186]mm · 13 of 248 slices shown, 15 images]
[im 14/248  soft-tissue]
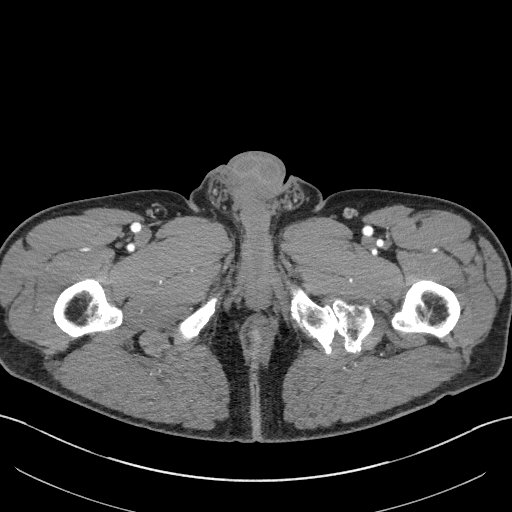
[im 14/248  bone]
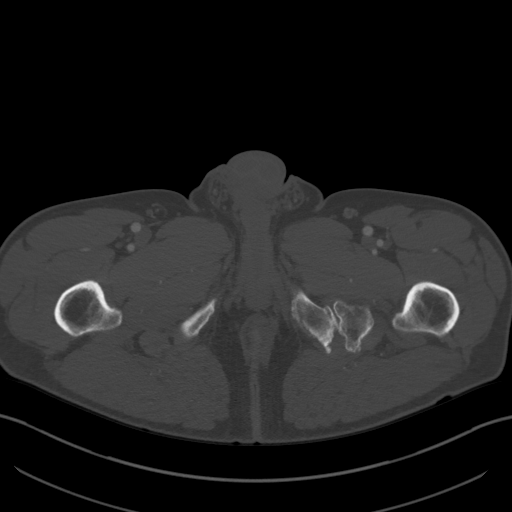
[im 40/248  soft-tissue]
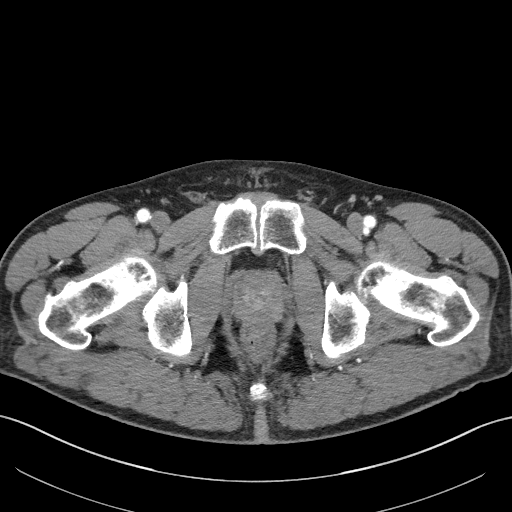
[im 53/248  soft-tissue]
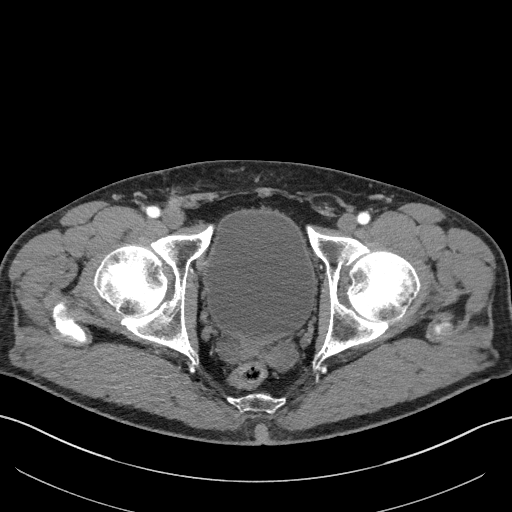
[im 66/248  soft-tissue]
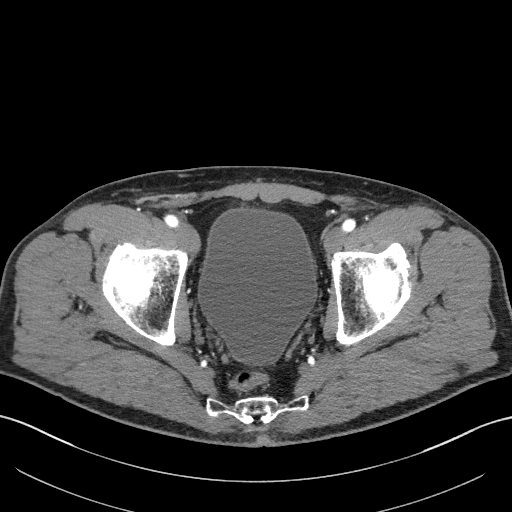
[im 92/248  soft-tissue]
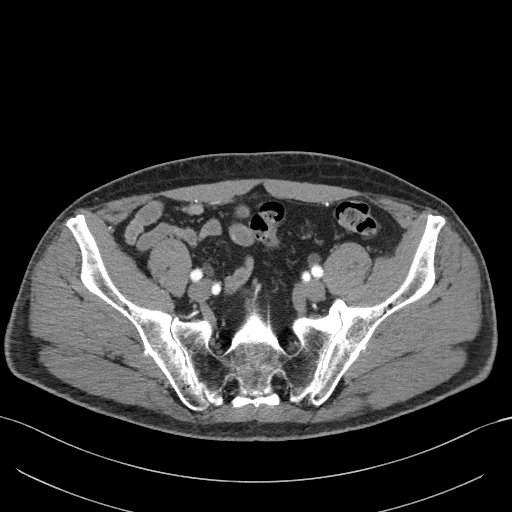
[im 105/248  soft-tissue]
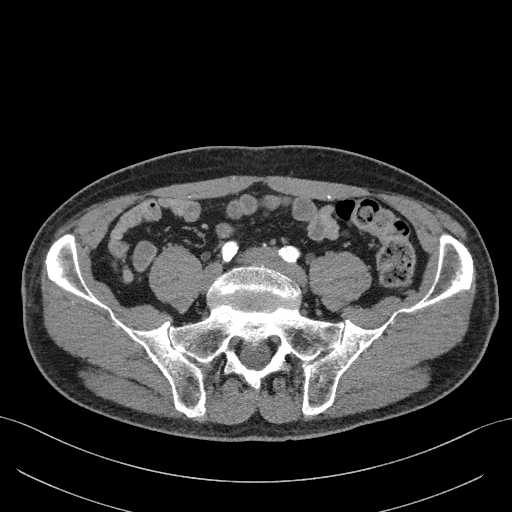
[im 131/248  soft-tissue]
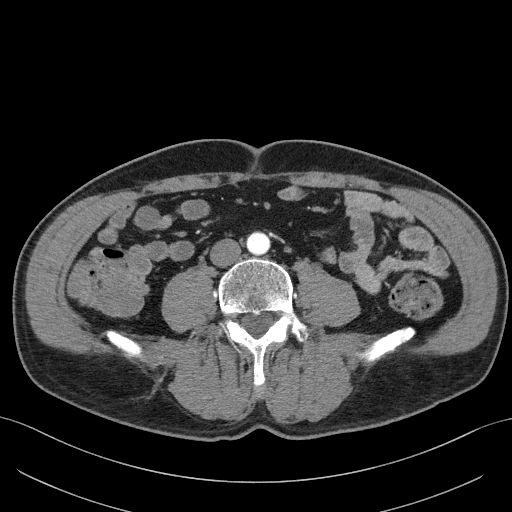
[im 144/248  soft-tissue]
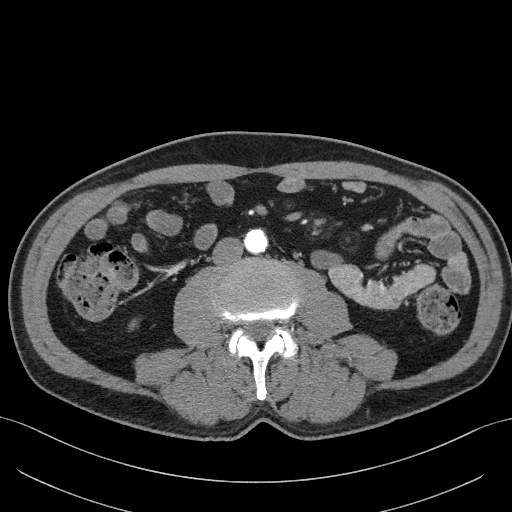
[im 157/248  soft-tissue]
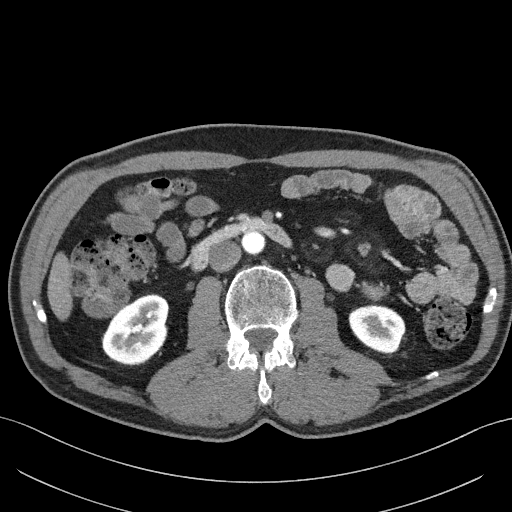
[im 157/248  bone]
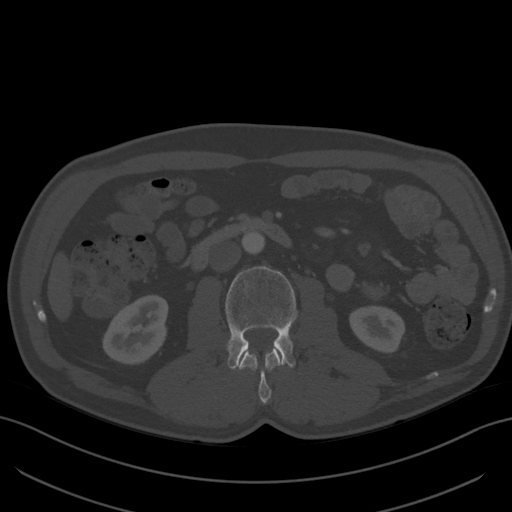
[im 183/248  soft-tissue]
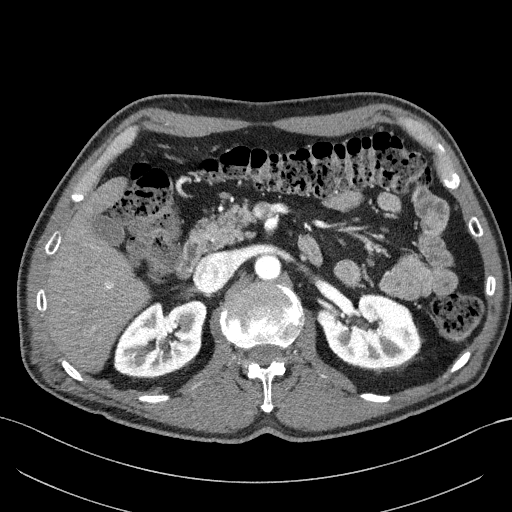
[im 196/248  soft-tissue]
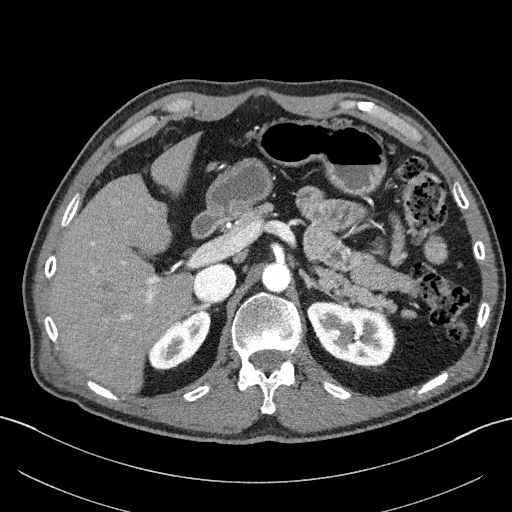
[im 209/248  soft-tissue]
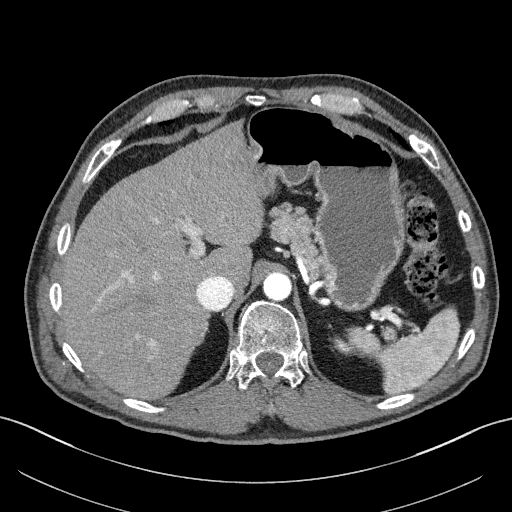
[im 235/248  soft-tissue]
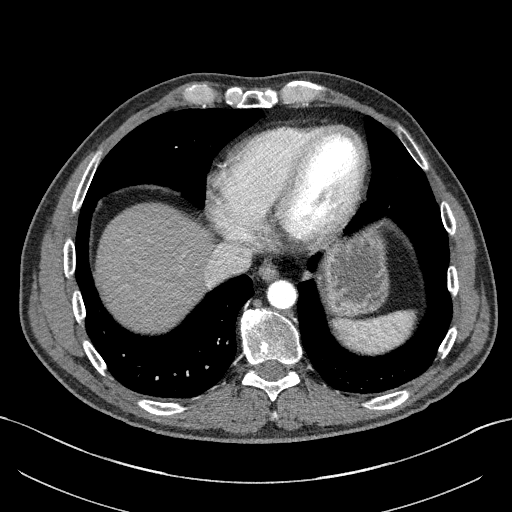

[Series 6: coronal · coronal · 0.97mm/px · 3 of 92 slices shown]
[im 31/92  soft-tissue]
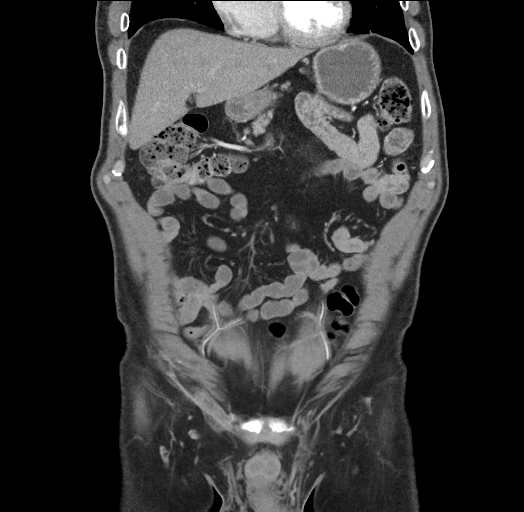
[im 41/92  soft-tissue]
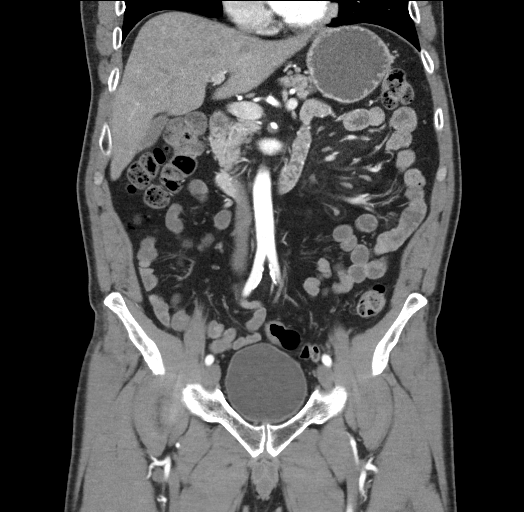
[im 51/92  soft-tissue]
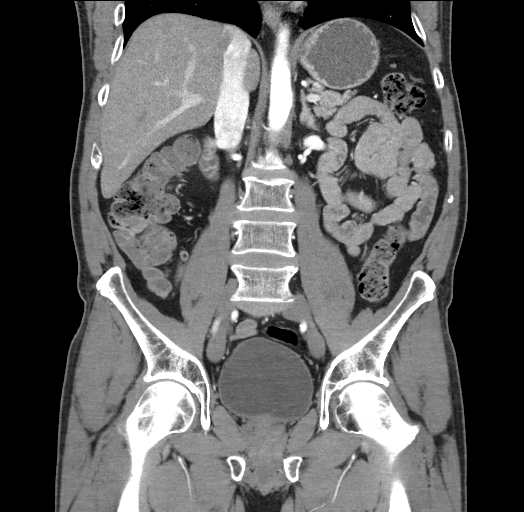

[16 of 46 positions shown; findings below may reference images not displayed]

FINDINGS: Lower chest: No significant pulmonary nodules or acute consolidative
airspace disease. Coronary atherosclerosis.

Hepatobiliary: Normal liver size. No liver mass. Normal gallbladder
with no radiopaque cholelithiasis. No biliary ductal dilatation.

Pancreas: Normal, with no mass or duct dilation.

Spleen: Normal size. No mass.

Adrenals/Urinary Tract: Normal adrenals. Simple 1.4 cm posterior
interpolar right renal cyst. No hydronephrosis. Normal bladder.

Stomach/Bowel: Normal non-distended stomach. Normal caliber small
bowel with no small bowel wall thickening. No small bowel wall
hyperenhancement. Previously visualized dilated proximal small bowel
loops are normal caliber on today's scan. No focal small bowel
caliber transition. No pneumatosis. No fistula. Normal appendix.
Normal large bowel with no diverticulosis, large bowel wall
thickening or pericolonic fat stranding.

Vascular/Lymphatic: Atherosclerotic nonaneurysmal abdominal aorta.
Patent portal, splenic, hepatic and renal veins. No pathologically
enlarged lymph nodes in the abdomen or pelvis. Haziness of the
nonenlarged left and central mesenteric lymph nodes is decreased in
prominence.

Reproductive: Mild prostatomegaly with nonspecific internal
prostatic calcification.

Other: No pneumoperitoneum, ascites or focal fluid collection.

Musculoskeletal: No aggressive appearing focal osseous lesions.
Bilateral L5 pars defects, chronic. Moderate thoracolumbar
spondylosis. Stable chronic posttraumatic change adjacent to left
ischium.
IMPRESSION: 1. Previously visualized small bowel obstruction has resolved. No
residual dilated or thick walled small bowel loops. No evidence of
bowel obstruction or active bowel inflammation on today's scan.
2. Haziness of the nonenlarged mesenteric lymph nodes, decreased in
prominence, suggestive of improving nonspecific mesenteric
lymphadenitis.
3.  Aortic Atherosclerosis ([id number]).
4. Coronary atherosclerosis.
5. Mild prostatomegaly.
6. Chronic bilateral L5 pars defects.

## 2020-06-04 DIAGNOSIS — R7309 Other abnormal glucose: Secondary | ICD-10-CM | POA: Diagnosis not present

## 2020-06-04 DIAGNOSIS — K219 Gastro-esophageal reflux disease without esophagitis: Secondary | ICD-10-CM | POA: Diagnosis not present

## 2020-06-04 DIAGNOSIS — K7581 Nonalcoholic steatohepatitis (NASH): Secondary | ICD-10-CM | POA: Diagnosis not present

## 2020-06-04 DIAGNOSIS — I10 Essential (primary) hypertension: Secondary | ICD-10-CM | POA: Diagnosis not present

## 2020-06-04 DIAGNOSIS — E7849 Other hyperlipidemia: Secondary | ICD-10-CM | POA: Diagnosis not present

## 2020-06-04 DIAGNOSIS — E782 Mixed hyperlipidemia: Secondary | ICD-10-CM | POA: Diagnosis not present

## 2020-06-06 DIAGNOSIS — I25111 Atherosclerotic heart disease of native coronary artery with angina pectoris with documented spasm: Secondary | ICD-10-CM | POA: Diagnosis not present

## 2020-06-06 DIAGNOSIS — E1169 Type 2 diabetes mellitus with other specified complication: Secondary | ICD-10-CM | POA: Diagnosis not present

## 2020-06-06 DIAGNOSIS — D171 Benign lipomatous neoplasm of skin and subcutaneous tissue of trunk: Secondary | ICD-10-CM | POA: Diagnosis not present

## 2020-06-06 DIAGNOSIS — E7849 Other hyperlipidemia: Secondary | ICD-10-CM | POA: Diagnosis not present

## 2020-06-06 DIAGNOSIS — K7581 Nonalcoholic steatohepatitis (NASH): Secondary | ICD-10-CM | POA: Diagnosis not present

## 2020-06-08 DIAGNOSIS — I25111 Atherosclerotic heart disease of native coronary artery with angina pectoris with documented spasm: Secondary | ICD-10-CM | POA: Diagnosis not present

## 2020-06-08 DIAGNOSIS — E1169 Type 2 diabetes mellitus with other specified complication: Secondary | ICD-10-CM | POA: Diagnosis not present

## 2020-06-08 DIAGNOSIS — E782 Mixed hyperlipidemia: Secondary | ICD-10-CM | POA: Diagnosis not present

## 2020-07-08 DIAGNOSIS — E782 Mixed hyperlipidemia: Secondary | ICD-10-CM | POA: Diagnosis not present

## 2020-07-08 DIAGNOSIS — I25111 Atherosclerotic heart disease of native coronary artery with angina pectoris with documented spasm: Secondary | ICD-10-CM | POA: Diagnosis not present

## 2020-07-08 DIAGNOSIS — K7581 Nonalcoholic steatohepatitis (NASH): Secondary | ICD-10-CM | POA: Diagnosis not present

## 2020-07-08 DIAGNOSIS — E1169 Type 2 diabetes mellitus with other specified complication: Secondary | ICD-10-CM | POA: Diagnosis not present

## 2020-08-07 DIAGNOSIS — E1169 Type 2 diabetes mellitus with other specified complication: Secondary | ICD-10-CM | POA: Diagnosis not present

## 2020-08-07 DIAGNOSIS — I25111 Atherosclerotic heart disease of native coronary artery with angina pectoris with documented spasm: Secondary | ICD-10-CM | POA: Diagnosis not present

## 2020-08-07 DIAGNOSIS — E782 Mixed hyperlipidemia: Secondary | ICD-10-CM | POA: Diagnosis not present

## 2020-08-22 ENCOUNTER — Other Ambulatory Visit: Payer: Self-pay | Admitting: *Deleted

## 2020-08-22 MED ORDER — METOPROLOL TARTRATE 25 MG PO TABS: 12.5000 mg | ORAL_TABLET | Freq: Two times a day (BID) | ORAL | 0 refills | Status: DC

## 2020-09-04 NOTE — Progress Notes (Signed)
Cardiology Office Note  Date: 09/05/2020   ID: Daniel Mcgee, DOB 1961/02/28, MRN [id number]  PCP:  Caryl Bis, MD  Cardiologist:  No primary care provider on file. Electrophysiologist:  None   Chief Complaint: 1 year follow up  History of Present Illness: Daniel Mcgee is a 60 y.o. male with a history of STEMI, CAD, DM2, HLD.  He was last seen by Dr Bronson Ing 09/05/2019. He denied any symptoms of chest pain , palpitations, SOB, orthostatic symptoms, edema, PND, orthopnea, syncope. His Hgb A1c was 6.4%. He was walking 2-3 miles 3-4 days each week. He was continuing ASA, metoprolol 12.5 mg po bid, Lipitor 80 mg po daily. He had joint pains in the past on Lipitor, Crestor, Zocor. Dr. Bronson Ing note if he should develop them in the future he would explore pitavastatin or Repatha.  He is here today for 1 year follow-up.  States he has been doing well or very well since last visit.  He is lost approximately 17 pounds since last visit and feeling much better.  He denies any issues with anginal or exertional symptoms, shortness of breath or DOE, palpitations or arrhythmias, orthostatic symptoms, lightheadedness, dizziness, presyncope or syncopal episodes.  Denies any CVA or TIA-like symptoms, PND or orthopnea.  No bleeding issues.  No claudication-like symptoms, DVT or PE-like symptoms, or lower extremity edema.  States he recently had some lab work at PCP office and all labs were good.   Past Medical History:  Diagnosis Date  . Family history of premature CAD   . HLD (hyperlipidemia)   . STEMI (ST elevation myocardial infarction) ([place])    2/19 PCI/DES x1 to Lcx, mild disease in RCA, LAD, normal EF    Past Surgical History:  Procedure Laterality Date  . CORONARY STENT PLACEMENT    . CORONARY/GRAFT ACUTE MI REVASCULARIZATION N/A 06/22/2017   Procedure: Coronary/Graft Acute MI Revascularization;  Surgeon: Sherren Mocha, MD;  Location: [facility];  Service:  Cardiovascular;  Laterality: N/A;  . INGUINAL HERNIA REPAIR Right 2016  . LEFT HEART CATH AND CORONARY ANGIOGRAPHY N/A 06/22/2017   Procedure: LEFT HEART CATH AND CORONARY ANGIOGRAPHY;  Surgeon: Sherren Mocha, MD;  Location: [facility];  Service: Cardiovascular;  Laterality: N/A;    Current Outpatient Medications  Medication Sig Dispense Refill  . aspirin EC 81 MG tablet Take 81 mg by mouth daily.    Marland Kitchen atorvastatin (LIPITOR) 80 MG tablet TAKE ONE TABLET BY MOUTH DAILY AT 6 PM 90 tablet 1  . metoprolol tartrate (LOPRESSOR) 25 MG tablet Take 0.5 tablets (12.5 mg total) by mouth 2 (two) times daily. 30 tablet 0  . Multiple Vitamin (MULTIVITAMIN) tablet Take 1 tablet by mouth daily.    . nitroGLYCERIN (NITROSTAT) 0.4 MG SL tablet Place 1 tablet (0.4 mg total) under the tongue every 5 (five) minutes x 3 doses as needed for chest pain. 25 tablet 3  . topiramate (TOPAMAX) 50 MG tablet Take 50 mg by mouth at bedtime.     No current facility-administered medications for this visit.   Allergies:  Hydrocodone   Social History: The patient  reports that he has never smoked. He quit smokeless tobacco use about 36 years ago.  His smokeless tobacco use included chew. He reports current alcohol use of about 1.0 standard drink of alcohol per week. He reports that he does not use drugs.   Family History: The patient's family history includes CAD in his brother; CAD (age of onset:  45) in his brother; Heart attack in his mother.   ROS:  Please see the history of present illness. Otherwise, complete review of systems is positive for none.  All other systems are reviewed and negative.   Physical Exam: VS:  BP 120/84   Pulse (!) 56   Ht 6\' 3"  (1.905 m)   Wt 222 lb 3.2 oz (100.8 kg)   SpO2 99%   BMI 27.77 kg/m , BMI Body mass index is 27.77 kg/m.  Wt Readings from Last 3 Encounters:  09/05/20 222 lb 3.2 oz (100.8 kg)  09/05/19 224 lb (101.6 kg)  12/26/18 223 lb (101.2 kg)    General: Patient  appears comfortable at rest. Neck: Supple, no elevated JVP or carotid bruits, no thyromegaly. Lungs: Clear to auscultation, nonlabored breathing at rest. Cardiac: Regular rate and rhythm, no S3 or significant systolic murmur, no pericardial rub. Extremities: No pitting edema, distal pulses 2+. Skin: Warm and dry. Musculoskeletal: No kyphosis. Neuropsychiatric: Alert and oriented x3, affect grossly appropriate.  ECG:  An ECG dated 09/05/2020 was personally reviewed today and demonstrated:  Sinus bradycardia with a rate of 56  Recent Labwork: No results found for requested labs within last 8760 hours.     Component Value Date/Time   CHOL 137 06/23/2017 0241   TRIG 104 06/23/2017 0241   HDL 47 06/23/2017 0241   CHOLHDL 2.9 06/23/2017 0241   VLDL 21 06/23/2017 0241   LDLCALC 69 06/23/2017 0241    Other Studies Reviewed Today:   Cardiac catheterization 06/22/2017 Coronary/Graft Acute MI Revascularization  LEFT HEART CATH AND CORONARY ANGIOGRAPHY    Conclusion  1.  Acute STEMI of the inferolateral wall secondary to critical stenosis of the left circumflex, treated successfully with primary PCI using a 3.0 x 15 mm Onyx DES 2.  Mild nonobstructive disease of the LAD and RCA 3.  Mild segmental contraction abnormality of the left ventricle with preserved overall LVEF estimated at 55%  Recommendations: Dual antiplatelet therapy without interruption using aspirin and ticagrelor for a minimum of 12 months.  Post MI medical therapy will be initiated.  Consider hospital discharge 24-48 hours.  Diagnostic Dominance: Right    Intervention      Echocardiogram 06/23/2017 Study Conclusions   - Left ventricle: The cavity size was normal. Wall thickness was  normal. Systolic function was normal. The estimated ejection  fraction was in the range of 60% to 65%. Wall motion was normal;  there were no regional wall motion abnormalities. Features are  consistent with a  pseudonormal left ventricular filling pattern,  with concomitant abnormal relaxation and increased filling  pressure (grade 2 diastolic dysfunction).   Assessment and Plan:  1. CAD in native artery   2. Mixed hyperlipidemia    1. CAD in native artery Denies any anginal or exertional symptoms.  Continue aspirin 81 mg daily, metoprolol 12.5 mg p.o. twice daily.  Nitroglycerin sublingual as needed.  2. Mixed hyperlipidemia Continue atorvastatin 80 mg p.o. daily.  He states he recently had lab work at PCP office and all labs were within normal limits.  Medication Adjustments/Labs and Tests Ordered: Current medicines are reviewed at length with the patient today.  Concerns regarding medicines are outlined above.   Disposition: Follow-up with Dr. Harl Bowie or APP 1 year  Signed, Levell July, NP 09/05/2020 10:26 AM    [facility] [place], [place], [place] [address] Phone: [phone]; Fax: [phone]

## 2020-09-05 ENCOUNTER — Encounter: Payer: Self-pay | Admitting: Family Medicine

## 2020-09-05 ENCOUNTER — Ambulatory Visit (INDEPENDENT_AMBULATORY_CARE_PROVIDER_SITE_OTHER): Payer: Medicare Other | Admitting: Family Medicine

## 2020-09-05 VITALS — BP 120/84 | HR 56 | Ht 75.0 in | Wt 222.2 lb

## 2020-09-05 DIAGNOSIS — E782 Mixed hyperlipidemia: Secondary | ICD-10-CM | POA: Diagnosis not present

## 2020-09-05 DIAGNOSIS — I2510 Atherosclerotic heart disease of native coronary artery without angina pectoris: Secondary | ICD-10-CM

## 2020-09-05 NOTE — Patient Instructions (Addendum)

## 2020-09-07 DIAGNOSIS — E1169 Type 2 diabetes mellitus with other specified complication: Secondary | ICD-10-CM | POA: Diagnosis not present

## 2020-09-07 DIAGNOSIS — E782 Mixed hyperlipidemia: Secondary | ICD-10-CM | POA: Diagnosis not present

## 2020-09-07 DIAGNOSIS — I25111 Atherosclerotic heart disease of native coronary artery with angina pectoris with documented spasm: Secondary | ICD-10-CM | POA: Diagnosis not present

## 2020-09-20 ENCOUNTER — Other Ambulatory Visit: Payer: Self-pay | Admitting: Family Medicine

## 2020-11-29 DIAGNOSIS — Z1159 Encounter for screening for other viral diseases: Secondary | ICD-10-CM | POA: Diagnosis not present

## 2020-11-29 DIAGNOSIS — I10 Essential (primary) hypertension: Secondary | ICD-10-CM | POA: Diagnosis not present

## 2020-11-29 DIAGNOSIS — K219 Gastro-esophageal reflux disease without esophagitis: Secondary | ICD-10-CM | POA: Diagnosis not present

## 2020-11-29 DIAGNOSIS — Z1329 Encounter for screening for other suspected endocrine disorder: Secondary | ICD-10-CM | POA: Diagnosis not present

## 2020-11-29 DIAGNOSIS — E782 Mixed hyperlipidemia: Secondary | ICD-10-CM | POA: Diagnosis not present

## 2020-11-29 DIAGNOSIS — E1169 Type 2 diabetes mellitus with other specified complication: Secondary | ICD-10-CM | POA: Diagnosis not present

## 2020-11-29 DIAGNOSIS — R7309 Other abnormal glucose: Secondary | ICD-10-CM | POA: Diagnosis not present

## 2020-11-29 DIAGNOSIS — E7849 Other hyperlipidemia: Secondary | ICD-10-CM | POA: Diagnosis not present

## 2020-12-03 ENCOUNTER — Encounter (INDEPENDENT_AMBULATORY_CARE_PROVIDER_SITE_OTHER): Payer: Self-pay | Admitting: *Deleted

## 2020-12-03 DIAGNOSIS — E1169 Type 2 diabetes mellitus with other specified complication: Secondary | ICD-10-CM | POA: Diagnosis not present

## 2020-12-03 DIAGNOSIS — D171 Benign lipomatous neoplasm of skin and subcutaneous tissue of trunk: Secondary | ICD-10-CM | POA: Diagnosis not present

## 2020-12-03 DIAGNOSIS — E7849 Other hyperlipidemia: Secondary | ICD-10-CM | POA: Diagnosis not present

## 2020-12-03 DIAGNOSIS — I25111 Atherosclerotic heart disease of native coronary artery with angina pectoris with documented spasm: Secondary | ICD-10-CM | POA: Diagnosis not present

## 2020-12-03 DIAGNOSIS — R4582 Worries: Secondary | ICD-10-CM | POA: Diagnosis not present

## 2020-12-03 DIAGNOSIS — Z1389 Encounter for screening for other disorder: Secondary | ICD-10-CM | POA: Diagnosis not present

## 2020-12-03 DIAGNOSIS — E1165 Type 2 diabetes mellitus with hyperglycemia: Secondary | ICD-10-CM | POA: Diagnosis not present

## 2020-12-17 DIAGNOSIS — Z0100 Encounter for examination of eyes and vision without abnormal findings: Secondary | ICD-10-CM | POA: Diagnosis not present

## 2020-12-17 DIAGNOSIS — E119 Type 2 diabetes mellitus without complications: Secondary | ICD-10-CM | POA: Diagnosis not present

## 2020-12-24 ENCOUNTER — Telehealth: Payer: Self-pay | Admitting: Cardiology

## 2020-12-24 NOTE — Telephone Encounter (Signed)
Daniel Mcgee came into the office with a statement dated for 09/05/2020. He had a new insurance card.  I have entered the correct information. He has requested that his bill be resubmitted to [facility].

## 2020-12-24 NOTE — Telephone Encounter (Signed)
Charges for 09/05/2020 transferred to [facility].  Charges will file on next claim run.

## 2021-03-19 ENCOUNTER — Other Ambulatory Visit: Payer: Self-pay | Admitting: Family Medicine

## 2021-04-09 DIAGNOSIS — I25111 Atherosclerotic heart disease of native coronary artery with angina pectoris with documented spasm: Secondary | ICD-10-CM | POA: Diagnosis not present

## 2021-04-09 DIAGNOSIS — E1169 Type 2 diabetes mellitus with other specified complication: Secondary | ICD-10-CM | POA: Diagnosis not present

## 2021-04-09 DIAGNOSIS — E782 Mixed hyperlipidemia: Secondary | ICD-10-CM | POA: Diagnosis not present

## 2021-04-14 ENCOUNTER — Encounter (INDEPENDENT_AMBULATORY_CARE_PROVIDER_SITE_OTHER): Payer: Self-pay | Admitting: Gastroenterology

## 2021-04-14 ENCOUNTER — Encounter (INDEPENDENT_AMBULATORY_CARE_PROVIDER_SITE_OTHER): Payer: Self-pay

## 2021-04-14 ENCOUNTER — Other Ambulatory Visit: Payer: Self-pay

## 2021-04-14 ENCOUNTER — Ambulatory Visit (INDEPENDENT_AMBULATORY_CARE_PROVIDER_SITE_OTHER): Payer: Medicare Other | Admitting: Gastroenterology

## 2021-04-14 ENCOUNTER — Telehealth (INDEPENDENT_AMBULATORY_CARE_PROVIDER_SITE_OTHER): Payer: Self-pay

## 2021-04-14 DIAGNOSIS — R198 Other specified symptoms and signs involving the digestive system and abdomen: Secondary | ICD-10-CM | POA: Diagnosis not present

## 2021-04-14 MED ORDER — PEG 3350-KCL-NA BICARB-NACL 420 G PO SOLR: 4000.0000 mL | ORAL | 0 refills | Status: DC

## 2021-04-14 NOTE — Patient Instructions (Signed)
Schedule colonoscopy

## 2021-04-14 NOTE — Progress Notes (Signed)
Daniel Mcgee, M.D. Gastroenterology & Hepatology [facility] [address] [place], [place] [address] Primary Care Physician: Caryl Bis, MD [place] [address]  Referring MD: PCP  Chief Complaint:  rectal pressure and tenesmus  History of Present Illness: Daniel Mcgee is a 60 y.o. male with PMH STEMI s/p stent placement, HLD, HTN,, who presents for evaluation of rectal pressure and tenesmus.  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 He states he did not have these symptoms on his days off. The pain eventually subsided and has not presented more episodes since he retired from his job.  Patient reports that for the last 2 years he has been feeling rectal pressure sensation and tenesmus during the night intermittently. He tried having a BM multiple times but did not have any stool.  Never had symptoms during the day. States there is no correlation or pattern for his symptoms, as it happened intermittently. He has not had these symptoms anymore for the last 3 months, though.  He used to have a BM every day in the past, but since he started metformin he has been moving his bowels every other day.  The patient denies having any nausea, vomiting, fever, chills, hematochezia, melena, hematemesis, abdominal distention, abdominal pain, diarrhea, jaundice, pruritus or weight loss.  Last QTM:AUQJF Last Colonoscopy: 7 years ago, patient reports, normal per patient but no report available - had it at [facility]  FHx: neg for any gastrointestinal/liver disease, father lung cancer, mother and sisters x2 breast cancer, prostate cancer x2 brothers Social: neg smoking, alcohol or illicit drug use Surgical: inguinal hernia  Past Medical History: Past Medical History:  Diagnosis Date    Family history of premature CAD    HLD (hyperlipidemia)    HTN (hypertension)    Hyperlipemia    STEMI (ST elevation myocardial infarction) ([place])    2/19 PCI/DES x1 to Lcx, mild disease in RCA, LAD, normal EF    Past Surgical History: Past Surgical History:  Procedure Laterality Date   CORONARY STENT PLACEMENT     CORONARY/GRAFT ACUTE MI REVASCULARIZATION N/A 06/22/2017   Procedure: Coronary/Graft Acute MI Revascularization;  Surgeon: Sherren Mocha, MD;  Location: [place] [facility];  Service: Cardiovascular;  Laterality: N/A;   INGUINAL HERNIA REPAIR Right 2016   LEFT HEART CATH AND CORONARY ANGIOGRAPHY N/A 06/22/2017   Procedure: LEFT HEART CATH AND CORONARY ANGIOGRAPHY;  Surgeon: Sherren Mocha, MD;  Location: [place] [facility];  Service: Cardiovascular;  Laterality: N/A;    Family History: Family History  Problem Relation Age of Onset   CAD Brother 39   CAD Brother    Heart attack Mother     Social History: Social History   Tobacco Use  Smoking Status Never  Smokeless Tobacco Former   Types: Chew   Quit date: 06/28/1984   Social History   Substance and Sexual Activity  Alcohol Use Yes   Alcohol/week: 1.0 standard drink   Types: 1 Cans of beer per week   Comment: occasionally    Social History   Substance and Sexual Activity  Drug Use No    Allergies: Allergies  Allergen Reactions   Hydrocodone Itching    Medications: Current Outpatient Medications  Medication Sig Dispense Refill   aspirin EC 81 MG tablet Take 81 mg  by mouth daily.     atorvastatin (LIPITOR) 80 MG tablet TAKE ONE TABLET BY MOUTH DAILY AT 6 PM 90 tablet 1   metFORMIN (GLUCOPHAGE) 500 MG tablet Take 1,000 mg by mouth daily at 6 (six) AM.     metoprolol tartrate (LOPRESSOR) 25 MG tablet TAKE 1/2 TABLET BY MOUTH TWICE DAILY (Patient taking differently: Take 25 mg by mouth daily at 6 (six) AM.) 90 tablet 2   Multiple Vitamin (MULTIVITAMIN) tablet Take 1 tablet by mouth daily.      nitroGLYCERIN (NITROSTAT) 0.4 MG SL tablet Place 1 tablet (0.4 mg total) under the tongue every 5 (five) minutes x 3 doses as needed for chest pain. 25 tablet 3   No current facility-administered medications for this visit.    Review of Systems: GENERAL: negative for malaise, night sweats HEENT: No changes in hearing or vision, no nose bleeds or other nasal problems. NECK: Negative for lumps, goiter, pain and significant neck swelling RESPIRATORY: Negative for cough, wheezing CARDIOVASCULAR: Negative for chest pain, leg swelling, palpitations, orthopnea GI: SEE HPI MUSCULOSKELETAL: Negative for joint pain or swelling, back pain, and muscle pain. SKIN: Negative for lesions, rash PSYCH: Negative for sleep disturbance, mood disorder and recent psychosocial stressors. HEMATOLOGY Negative for prolonged bleeding, bruising easily, and swollen nodes. ENDOCRINE: Negative for cold or heat intolerance, polyuria, polydipsia and goiter. NEURO: negative for tremor, gait imbalance, syncope and seizures. The remainder of the review of systems is noncontributory.   Physical Exam: BP 140/85 (BP Location: Left Arm, Patient Position: Sitting, Cuff Size: Large)   Pulse 81   Temp 98.8 F (37.1 C) (Oral)   Ht 6\' 3"  (1.905 m)   Wt 221 lb 11.2 oz (100.6 kg)   BMI 27.71 kg/m  GENERAL: The patient is AO x3, in no acute distress. HEENT: Head is normocephalic and atraumatic. EOMI are intact. Mouth is well hydrated and without lesions. NECK: Supple. No masses LUNGS: Clear to auscultation. No presence of rhonchi/wheezing/rales. Adequate chest expansion HEART: RRR, normal s1 and s2. ABDOMEN: Soft, nontender, no guarding, no peritoneal signs, and nondistended. BS +. No masses. EXTREMITIES: Without any cyanosis, clubbing, rash, lesions or edema. NEUROLOGIC: AOx3, no focal motor deficit. SKIN: no jaundice, no rashes   Imaging/Labs: as above  I personally reviewed and interpreted the available labs, imaging  and endoscopic files.  Impression and Plan: Daniel Mcgee is a 59 y.o. male with PMH STEMI s/p stent placement, HLD, HTN,, who presents for evaluation of rectal pressure and tenesmus.  The patient presented intermittent episodes of tenesmus and rectal discomfort of unclear etiology.  These episodes have resolved on their own.  It is possible that they are related to bowel hypersensitivity but given his prior episodes of severe pain, I consider important to rule out any intraluminal etiology with a colonoscopy.  The patient understood and agreed.  - Schedule colonoscopy  All questions were answered.      Daniel Peppers, MD Gastroenterology and Hepatology [facility]

## 2021-04-14 NOTE — Telephone Encounter (Signed)
Daniel Mcgee, CMA  

## 2021-04-14 NOTE — H&P (View-Only) (Signed)
Daniel Mcgee, M.D. Gastroenterology & Hepatology [facility] [address] [place], [place] [address] Primary Care Physician: Caryl Bis, MD [place] [address]  Referring MD: PCP  Chief Complaint:  rectal pressure and tenesmus  History of Present Illness: Daniel Mcgee is a 60 y.o. male with PMH STEMI s/p stent placement, HLD, HTN,, who presents for evaluation of rectal pressure and tenesmus.  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 He states he did not have these symptoms on his days off. The pain eventually subsided and has not presented more episodes since he retired from his job.  Patient reports that for the last 2 years he has been feeling rectal pressure sensation and tenesmus during the night intermittently. He tried having a BM multiple times but did not have any stool.  Never had symptoms during the day. States there is no correlation or pattern for his symptoms, as it happened intermittently. He has not had these symptoms anymore for the last 3 months, though.  He used to have a BM every day in the past, but since he started metformin he has been moving his bowels every other day.  The patient denies having any nausea, vomiting, fever, chills, hematochezia, melena, hematemesis, abdominal distention, abdominal pain, diarrhea, jaundice, pruritus or weight loss.  Last MGQ:QPYPP Last Colonoscopy: 7 years ago, patient reports, normal per patient but no report available - had it at [facility]  FHx: neg for any gastrointestinal/liver disease, father lung cancer, mother and sisters x2 breast cancer, prostate cancer x2 brothers Social: neg smoking, alcohol or illicit drug use Surgical: inguinal hernia  Past Medical History: Past Medical History:  Diagnosis Date    Family history of premature CAD    HLD (hyperlipidemia)    HTN (hypertension)    Hyperlipemia    STEMI (ST elevation myocardial infarction) ([place])    2/19 PCI/DES x1 to Lcx, mild disease in RCA, LAD, normal EF    Past Surgical History: Past Surgical History:  Procedure Laterality Date   CORONARY STENT PLACEMENT     CORONARY/GRAFT ACUTE MI REVASCULARIZATION N/A 06/22/2017   Procedure: Coronary/Graft Acute MI Revascularization;  Surgeon: Sherren Mocha, MD;  Location: [facility];  Service: Cardiovascular;  Laterality: N/A;   INGUINAL HERNIA REPAIR Right 2016   LEFT HEART CATH AND CORONARY ANGIOGRAPHY N/A 06/22/2017   Procedure: LEFT HEART CATH AND CORONARY ANGIOGRAPHY;  Surgeon: Sherren Mocha, MD;  Location: [facility];  Service: Cardiovascular;  Laterality: N/A;    Family History: Family History  Problem Relation Age of Onset   CAD Brother 35   CAD Brother    Heart attack Mother     Social History: Social History   Tobacco Use  Smoking Status Never  Smokeless Tobacco Former   Types: Chew   Quit date: 06/28/1984   Social History   Substance and Sexual Activity  Alcohol Use Yes   Alcohol/week: 1.0 standard drink   Types: 1 Cans of beer per week   Comment: occasionally    Social History   Substance and Sexual Activity  Drug Use No    Allergies: Allergies  Allergen Reactions   Hydrocodone Itching    Medications: Current Outpatient Medications  Medication Sig Dispense Refill   aspirin EC 81 MG tablet Take 81 mg  by mouth daily.     atorvastatin (LIPITOR) 80 MG tablet TAKE ONE TABLET BY MOUTH DAILY AT 6 PM 90 tablet 1   metFORMIN (GLUCOPHAGE) 500 MG tablet Take 1,000 mg by mouth daily at 6 (six) AM.     metoprolol tartrate (LOPRESSOR) 25 MG tablet TAKE 1/2 TABLET BY MOUTH TWICE DAILY (Patient taking differently: Take 25 mg by mouth daily at 6 (six) AM.) 90 tablet 2   Multiple Vitamin (MULTIVITAMIN) tablet Take 1 tablet by mouth daily.      nitroGLYCERIN (NITROSTAT) 0.4 MG SL tablet Place 1 tablet (0.4 mg total) under the tongue every 5 (five) minutes x 3 doses as needed for chest pain. 25 tablet 3   No current facility-administered medications for this visit.    Review of Systems: GENERAL: negative for malaise, night sweats HEENT: No changes in hearing or vision, no nose bleeds or other nasal problems. NECK: Negative for lumps, goiter, pain and significant neck swelling RESPIRATORY: Negative for cough, wheezing CARDIOVASCULAR: Negative for chest pain, leg swelling, palpitations, orthopnea GI: SEE HPI MUSCULOSKELETAL: Negative for joint pain or swelling, back pain, and muscle pain. SKIN: Negative for lesions, rash PSYCH: Negative for sleep disturbance, mood disorder and recent psychosocial stressors. HEMATOLOGY Negative for prolonged bleeding, bruising easily, and swollen nodes. ENDOCRINE: Negative for cold or heat intolerance, polyuria, polydipsia and goiter. NEURO: negative for tremor, gait imbalance, syncope and seizures. The remainder of the review of systems is noncontributory.   Physical Exam: BP 140/85 (BP Location: Left Arm, Patient Position: Sitting, Cuff Size: Large)    Pulse 81    Temp 98.8 F (37.1 C) (Oral)    Ht 6\' 3"  (1.905 m)    Wt 221 lb 11.2 oz (100.6 kg)    BMI 27.71 kg/m  GENERAL: The patient is AO x3, in no acute distress. HEENT: Head is normocephalic and atraumatic. EOMI are intact. Mouth is well hydrated and without lesions. NECK: Supple. No masses LUNGS: Clear to auscultation. No presence of rhonchi/wheezing/rales. Adequate chest expansion HEART: RRR, normal s1 and s2. ABDOMEN: Soft, nontender, no guarding, no peritoneal signs, and nondistended. BS +. No masses. EXTREMITIES: Without any cyanosis, clubbing, rash, lesions or edema. NEUROLOGIC: AOx3, no focal motor deficit. SKIN: no jaundice, no rashes   Imaging/Labs: as above  I personally reviewed and interpreted the available labs, imaging  and endoscopic files.  Impression and Plan: Daniel Mcgee is a 60 y.o. male with PMH STEMI s/p stent placement, HLD, HTN,, who presents for evaluation of rectal pressure and tenesmus.  The patient presented intermittent episodes of tenesmus and rectal discomfort of unclear etiology.  These episodes have resolved on their own.  It is possible that they are related to bowel hypersensitivity but given his prior episodes of severe pain, I consider important to rule out any intraluminal etiology with a colonoscopy.  The patient understood and agreed.  - Schedule colonoscopy  All questions were answered.      Daniel Peppers, MD Gastroenterology and Hepatology [facility]

## 2021-04-15 ENCOUNTER — Other Ambulatory Visit (INDEPENDENT_AMBULATORY_CARE_PROVIDER_SITE_OTHER): Payer: Self-pay

## 2021-04-18 DIAGNOSIS — E7849 Other hyperlipidemia: Secondary | ICD-10-CM | POA: Diagnosis not present

## 2021-04-18 DIAGNOSIS — E782 Mixed hyperlipidemia: Secondary | ICD-10-CM | POA: Diagnosis not present

## 2021-04-18 DIAGNOSIS — K219 Gastro-esophageal reflux disease without esophagitis: Secondary | ICD-10-CM | POA: Diagnosis not present

## 2021-04-18 DIAGNOSIS — E1165 Type 2 diabetes mellitus with hyperglycemia: Secondary | ICD-10-CM | POA: Diagnosis not present

## 2021-04-18 DIAGNOSIS — I10 Essential (primary) hypertension: Secondary | ICD-10-CM | POA: Diagnosis not present

## 2021-04-21 DIAGNOSIS — I25111 Atherosclerotic heart disease of native coronary artery with angina pectoris with documented spasm: Secondary | ICD-10-CM | POA: Diagnosis not present

## 2021-04-21 DIAGNOSIS — D171 Benign lipomatous neoplasm of skin and subcutaneous tissue of trunk: Secondary | ICD-10-CM | POA: Diagnosis not present

## 2021-04-21 DIAGNOSIS — E1169 Type 2 diabetes mellitus with other specified complication: Secondary | ICD-10-CM | POA: Diagnosis not present

## 2021-04-21 DIAGNOSIS — Z0001 Encounter for general adult medical examination with abnormal findings: Secondary | ICD-10-CM | POA: Diagnosis not present

## 2021-04-21 DIAGNOSIS — E1165 Type 2 diabetes mellitus with hyperglycemia: Secondary | ICD-10-CM | POA: Diagnosis not present

## 2021-04-21 DIAGNOSIS — E7849 Other hyperlipidemia: Secondary | ICD-10-CM | POA: Diagnosis not present

## 2021-04-21 DIAGNOSIS — Z23 Encounter for immunization: Secondary | ICD-10-CM | POA: Diagnosis not present

## 2021-05-14 ENCOUNTER — Ambulatory Visit (HOSPITAL_COMMUNITY): Payer: Medicare Other | Admitting: Anesthesiology

## 2021-05-14 ENCOUNTER — Other Ambulatory Visit: Payer: Self-pay

## 2021-05-14 ENCOUNTER — Ambulatory Visit (HOSPITAL_COMMUNITY)
Admission: RE | Admit: 2021-05-14 | Discharge: 2021-05-14 | Disposition: A | Discharge status: Home / Self Care | Payer: Medicare Other | Attending: Gastroenterology | Admitting: Gastroenterology

## 2021-05-14 ENCOUNTER — Encounter (HOSPITAL_COMMUNITY): Payer: Self-pay | Admitting: Gastroenterology

## 2021-05-14 ENCOUNTER — Encounter (HOSPITAL_COMMUNITY)
Admission: RE | Disposition: A | Discharge status: Home / Self Care | Payer: Self-pay | Source: Home / Self Care | Attending: Gastroenterology

## 2021-05-14 DIAGNOSIS — E785 Hyperlipidemia, unspecified: Secondary | ICD-10-CM | POA: Insufficient documentation

## 2021-05-14 DIAGNOSIS — D122 Benign neoplasm of ascending colon: Secondary | ICD-10-CM | POA: Insufficient documentation

## 2021-05-14 DIAGNOSIS — D120 Benign neoplasm of cecum: Secondary | ICD-10-CM | POA: Insufficient documentation

## 2021-05-14 DIAGNOSIS — I252 Old myocardial infarction: Secondary | ICD-10-CM | POA: Insufficient documentation

## 2021-05-14 DIAGNOSIS — R198 Other specified symptoms and signs involving the digestive system and abdomen: Secondary | ICD-10-CM | POA: Diagnosis not present

## 2021-05-14 DIAGNOSIS — E119 Type 2 diabetes mellitus without complications: Secondary | ICD-10-CM | POA: Diagnosis not present

## 2021-05-14 DIAGNOSIS — K648 Other hemorrhoids: Secondary | ICD-10-CM | POA: Insufficient documentation

## 2021-05-14 DIAGNOSIS — I10 Essential (primary) hypertension: Secondary | ICD-10-CM | POA: Diagnosis not present

## 2021-05-14 DIAGNOSIS — K6289 Other specified diseases of anus and rectum: Secondary | ICD-10-CM | POA: Diagnosis not present

## 2021-05-14 DIAGNOSIS — I2510 Atherosclerotic heart disease of native coronary artery without angina pectoris: Secondary | ICD-10-CM | POA: Diagnosis not present

## 2021-05-14 DIAGNOSIS — Z955 Presence of coronary angioplasty implant and graft: Secondary | ICD-10-CM | POA: Diagnosis not present

## 2021-05-14 DIAGNOSIS — K635 Polyp of colon: Secondary | ICD-10-CM | POA: Diagnosis not present

## 2021-05-14 HISTORY — DX: Type 2 diabetes mellitus without complications: E11.9

## 2021-05-14 HISTORY — PX: COLONOSCOPY WITH PROPOFOL: SHX5780

## 2021-05-14 HISTORY — PX: POLYPECTOMY: SHX5525

## 2021-05-14 LAB — HM COLONOSCOPY

## 2021-05-14 LAB — GLUCOSE, CAPILLARY: Glucose-Capillary: 120 mg/dL — ABNORMAL HIGH (ref 70–99)

## 2021-05-14 SURGERY — COLONOSCOPY WITH PROPOFOL
Anesthesia: General

## 2021-05-14 MED ORDER — LIDOCAINE HCL (CARDIAC) PF 100 MG/5ML IV SOSY
PREFILLED_SYRINGE | INTRAVENOUS | Status: DC | PRN
  Administered 2021-05-14: 50 mg via INTRAVENOUS

## 2021-05-14 MED ORDER — LACTATED RINGERS IV SOLN: INTRAVENOUS | Status: DC

## 2021-05-14 MED ORDER — PROPOFOL 10 MG/ML IV BOLUS
INTRAVENOUS | Status: DC | PRN
Start: 2021-05-14 — End: 2021-05-14
  Administered 2021-05-14: 50 mg via INTRAVENOUS
  Administered 2021-05-14: 100 mg via INTRAVENOUS
  Administered 2021-05-14 (×2): 50 mg via INTRAVENOUS

## 2021-05-14 MED ORDER — PROPOFOL 500 MG/50ML IV EMUL
INTRAVENOUS | Status: DC | PRN
  Administered 2021-05-14: 100 ug/kg/min via INTRAVENOUS

## 2021-05-14 NOTE — Discharge Instructions (Addendum)
You are being discharged to home.  Resume your previous diet.  We are waiting for your pathology results.  Your physician has recommended a repeat colonoscopy for surveillance based on pathology results.  If worsening straining to have a bowel movement, may take Miralax daily

## 2021-05-14 NOTE — Op Note (Signed)
[facility] Patient Name: Daniel Mcgee Procedure Date: 05/14/2021 8:09 AM MRN: [id number] Date of Birth: 1961-03-11 Attending MD: [name] ,  CSN: [id number] Age: 61 Admit Type: Outpatient Procedure:                Colonoscopy Indications:              Rectal pain Providers:                [name], Janeece Riggers, RN, Hughie Closs,                            RN, Randa Spike, Technician Referring MD:              Medicines:                Monitored Anesthesia Care Complications:            No immediate complications. Estimated Blood Loss:     Estimated blood loss: none. Procedure:                Pre-Anesthesia Assessment:                           - Prior to the procedure, a History and Physical                            was performed, and patient medications, allergies                            and sensitivities were reviewed. The patient's                            tolerance of previous anesthesia was reviewed.                           - The risks and benefits of the procedure and the                            sedation options and risks were discussed with the                            patient. All questions were answered and informed                            consent was obtained.                           - ASA Grade Assessment: II - A patient with mild                            systemic disease.                           After obtaining informed consent, the colonoscope                            was passed under direct vision. Throughout the  procedure, the patient's blood pressure, pulse, and                            oxygen saturations were monitored continuously. The                            [id number]-HQ190L ([id number]) scope was introduced through                            the anus and advanced to the the terminal ileum.                            The colonoscopy was performed without difficulty.                             The patient tolerated the procedure well. The                            quality of the bowel preparation was good. Scope In: 8:21:33 AM Scope Out: 8:45:09 AM Scope Withdrawal Time: 0 hours 20 minutes 50 seconds  Total Procedure Duration: 0 hours 23 minutes 36 seconds  Findings:      The perianal and digital rectal examinations were normal.      The terminal ileum appeared normal.      Two sessile polyps were found in the ascending colon and cecum. The       polyps were 3 to 5 mm in size. These polyps were removed with a cold       snare. Resection and retrieval were complete.      Non-bleeding internal hemorrhoids were found during retroflexion. The       hemorrhoids were small. Impression:               - The examined portion of the ileum was normal.                           - Two 3 to 5 mm polyps in the ascending colon and                            in the cecum, removed with a cold snare. Resected                            and retrieved.                           - Non-bleeding internal hemorrhoids. Moderate Sedation:      Per Anesthesia Care Recommendation:           - Discharge patient to home (ambulatory).                           - Resume previous diet.                           - Await pathology results.                           -  Repeat colonoscopy for surveillance based on                            pathology results. Procedure Code(s):        --- Professional ---                           [phone], Colonoscopy, flexible; with removal of                            tumor(s), polyp(s), or other lesion(s) by snare                            technique Diagnosis Code(s):        --- Professional ---                           K64.8, Other hemorrhoids                           K63.5, Polyp of colon                           K62.89, Other specified diseases of anus and rectum CPT copyright 2019 American Medical Association. All rights reserved. The codes documented in this  report are preliminary and upon coder review may  be revised to meet current compliance requirements. [name], MD [name],  05/14/2021 8:50:14 AM This report has been signed electronically. Number of Addenda: 0

## 2021-05-14 NOTE — Anesthesia Procedure Notes (Signed)
Date/Time: 05/14/2021 8:21 AM Performed by: Orlie Dakin, CRNA Pre-anesthesia Checklist: Patient identified, Emergency Drugs available, Suction available and Patient being monitored Patient Re-evaluated:Patient Re-evaluated prior to induction Oxygen Delivery Method: Nasal cannula Induction Type: IV induction Placement Confirmation: positive ETCO2

## 2021-05-14 NOTE — Anesthesia Postprocedure Evaluation (Signed)
Anesthesia Post Note  Patient: Daniel Mcgee  Procedure(s) Performed: COLONOSCOPY WITH PROPOFOL POLYPECTOMY  Patient location during evaluation: Phase II Anesthesia Type: General Level of consciousness: awake Pain management: pain level controlled Vital Signs Assessment: post-procedure vital signs reviewed and stable Respiratory status: spontaneous breathing and respiratory function stable Cardiovascular status: blood pressure returned to baseline and stable Postop Assessment: no headache and no apparent nausea or vomiting Anesthetic complications: no Comments: Late entry   No notable events documented.   Last Vitals:  Vitals:   05/14/21 0711 05/14/21 0848  BP: (!) 143/84 113/69  Pulse: 88   Resp: 16 15  Temp: 37.1 C (!) 36.4 C  SpO2: 97% 92%    Last Pain:  Vitals:   05/14/21 0848  TempSrc: Oral  PainSc: 0-No pain                 Louann Sjogren

## 2021-05-14 NOTE — Interval H&P Note (Signed)
History and Physical Interval Note:  05/14/2021 7:39 AM  Daniel Mcgee  has presented today for surgery, with the diagnosis of Rectal Pressure.  The various methods of treatment have been discussed with the patient and family. After consideration of risks, benefits and other options for treatment, the patient has consented to  Procedure(s) with comments: COLONOSCOPY WITH PROPOFOL (N/A) - 8:15 as a surgical intervention.  The patient's history has been reviewed, patient examined, no change in status, stable for surgery.  I have reviewed the patient's chart and labs.  Questions were answered to the patient's satisfaction.     [name]

## 2021-05-14 NOTE — Anesthesia Preprocedure Evaluation (Signed)
Anesthesia Evaluation  Patient identified by MRN, date of birth, ID band Patient awake    Reviewed: Allergy & Precautions, H&P , NPO status , Patient's Chart, lab work & pertinent test results, reviewed documented beta blocker date and time   Airway Mallampati: II  TM Distance: >3 FB Neck ROM: full    Dental no notable dental hx.    Pulmonary neg pulmonary ROS,    Pulmonary exam normal breath sounds clear to auscultation       Cardiovascular Exercise Tolerance: Good hypertension, + CAD, + Past MI and + Cardiac Stents   Rhythm:regular Rate:Normal     Neuro/Psych negative neurological ROS  negative psych ROS   GI/Hepatic negative GI ROS, Neg liver ROS,   Endo/Other  negative endocrine ROSdiabetes  Renal/GU negative Renal ROS  negative genitourinary   Musculoskeletal   Abdominal   Peds  Hematology negative hematology ROS (+)   Anesthesia Other Findings   Reproductive/Obstetrics negative OB ROS                             Anesthesia Physical Anesthesia Plan  ASA: 3  Anesthesia Plan: General   Post-op Pain Management:    Induction:   PONV Risk Score and Plan: Propofol infusion  Airway Management Planned:   Additional Equipment:   Intra-op Plan:   Post-operative Plan:   Informed Consent: I have reviewed the patients History and Physical, chart, labs and discussed the procedure including the risks, benefits and alternatives for the proposed anesthesia with the patient or authorized representative who has indicated his/her understanding and acceptance.     Dental Advisory Given  Plan Discussed with: CRNA  Anesthesia Plan Comments:         Anesthesia Quick Evaluation

## 2021-05-14 NOTE — Transfer of Care (Signed)
Immediate Anesthesia Transfer of Care Note  Patient: Daniel Mcgee  Procedure(s) Performed: COLONOSCOPY WITH PROPOFOL POLYPECTOMY  Patient Location: Endoscopy Unit  Anesthesia Type:General  Level of Consciousness: drowsy  Airway & Oxygen Therapy: Patient Spontanous Breathing  Post-op Assessment: Report given to RN and Post -op Vital signs reviewed and stable  Post vital signs: Reviewed and stable  Last Vitals:  Vitals Value Taken Time  BP    Temp    Pulse    Resp    SpO2      Last Pain:  Vitals:   05/14/21 0816  TempSrc: Oral  PainSc: 0-No pain      Patients Stated Pain Goal: 7 (04/28/74 8832)  Complications: No notable events documented.

## 2021-05-15 LAB — SURGICAL PATHOLOGY

## 2021-05-16 ENCOUNTER — Encounter (INDEPENDENT_AMBULATORY_CARE_PROVIDER_SITE_OTHER): Payer: Self-pay | Admitting: *Deleted

## 2021-05-20 ENCOUNTER — Encounter (HOSPITAL_COMMUNITY): Payer: Self-pay | Admitting: Gastroenterology

## 2021-05-21 DIAGNOSIS — H2513 Age-related nuclear cataract, bilateral: Secondary | ICD-10-CM | POA: Diagnosis not present

## 2021-05-21 DIAGNOSIS — D3132 Benign neoplasm of left choroid: Secondary | ICD-10-CM | POA: Diagnosis not present

## 2021-05-21 DIAGNOSIS — E119 Type 2 diabetes mellitus without complications: Secondary | ICD-10-CM | POA: Diagnosis not present

## 2021-07-23 DIAGNOSIS — J029 Acute pharyngitis, unspecified: Secondary | ICD-10-CM | POA: Diagnosis not present

## 2021-07-23 DIAGNOSIS — J329 Chronic sinusitis, unspecified: Secondary | ICD-10-CM | POA: Diagnosis not present

## 2021-08-07 DIAGNOSIS — E782 Mixed hyperlipidemia: Secondary | ICD-10-CM | POA: Diagnosis not present

## 2021-08-07 DIAGNOSIS — E7849 Other hyperlipidemia: Secondary | ICD-10-CM | POA: Diagnosis not present

## 2021-08-07 DIAGNOSIS — K219 Gastro-esophageal reflux disease without esophagitis: Secondary | ICD-10-CM | POA: Diagnosis not present

## 2021-08-07 DIAGNOSIS — E1165 Type 2 diabetes mellitus with hyperglycemia: Secondary | ICD-10-CM | POA: Diagnosis not present

## 2021-08-07 DIAGNOSIS — I10 Essential (primary) hypertension: Secondary | ICD-10-CM | POA: Diagnosis not present

## 2021-08-13 DIAGNOSIS — E1169 Type 2 diabetes mellitus with other specified complication: Secondary | ICD-10-CM | POA: Diagnosis not present

## 2021-08-13 DIAGNOSIS — E7849 Other hyperlipidemia: Secondary | ICD-10-CM | POA: Diagnosis not present

## 2021-08-13 DIAGNOSIS — I25111 Atherosclerotic heart disease of native coronary artery with angina pectoris with documented spasm: Secondary | ICD-10-CM | POA: Diagnosis not present

## 2021-08-13 DIAGNOSIS — K7581 Nonalcoholic steatohepatitis (NASH): Secondary | ICD-10-CM | POA: Diagnosis not present

## 2021-08-13 DIAGNOSIS — E1165 Type 2 diabetes mellitus with hyperglycemia: Secondary | ICD-10-CM | POA: Diagnosis not present

## 2021-08-13 DIAGNOSIS — D171 Benign lipomatous neoplasm of skin and subcutaneous tissue of trunk: Secondary | ICD-10-CM | POA: Diagnosis not present

## 2022-01-13 DIAGNOSIS — E1165 Type 2 diabetes mellitus with hyperglycemia: Secondary | ICD-10-CM | POA: Diagnosis not present

## 2022-01-13 DIAGNOSIS — E782 Mixed hyperlipidemia: Secondary | ICD-10-CM | POA: Diagnosis not present

## 2022-01-13 DIAGNOSIS — Z1329 Encounter for screening for other suspected endocrine disorder: Secondary | ICD-10-CM | POA: Diagnosis not present

## 2022-01-13 DIAGNOSIS — I10 Essential (primary) hypertension: Secondary | ICD-10-CM | POA: Diagnosis not present

## 2022-01-13 DIAGNOSIS — E7849 Other hyperlipidemia: Secondary | ICD-10-CM | POA: Diagnosis not present

## 2022-01-13 DIAGNOSIS — K7581 Nonalcoholic steatohepatitis (NASH): Secondary | ICD-10-CM | POA: Diagnosis not present

## 2022-01-13 DIAGNOSIS — K219 Gastro-esophageal reflux disease without esophagitis: Secondary | ICD-10-CM | POA: Diagnosis not present

## 2022-01-15 DIAGNOSIS — D171 Benign lipomatous neoplasm of skin and subcutaneous tissue of trunk: Secondary | ICD-10-CM | POA: Diagnosis not present

## 2022-01-15 DIAGNOSIS — E1169 Type 2 diabetes mellitus with other specified complication: Secondary | ICD-10-CM | POA: Diagnosis not present

## 2022-01-15 DIAGNOSIS — E7849 Other hyperlipidemia: Secondary | ICD-10-CM | POA: Diagnosis not present

## 2022-01-15 DIAGNOSIS — E1165 Type 2 diabetes mellitus with hyperglycemia: Secondary | ICD-10-CM | POA: Diagnosis not present

## 2022-01-15 DIAGNOSIS — I25118 Atherosclerotic heart disease of native coronary artery with other forms of angina pectoris: Secondary | ICD-10-CM | POA: Diagnosis not present

## 2022-01-15 DIAGNOSIS — K7581 Nonalcoholic steatohepatitis (NASH): Secondary | ICD-10-CM | POA: Diagnosis not present

## 2022-03-27 DIAGNOSIS — Z23 Encounter for immunization: Secondary | ICD-10-CM | POA: Diagnosis not present

## 2022-05-22 DIAGNOSIS — E7849 Other hyperlipidemia: Secondary | ICD-10-CM | POA: Diagnosis not present

## 2022-05-22 DIAGNOSIS — K219 Gastro-esophageal reflux disease without esophagitis: Secondary | ICD-10-CM | POA: Diagnosis not present

## 2022-05-22 DIAGNOSIS — I10 Essential (primary) hypertension: Secondary | ICD-10-CM | POA: Diagnosis not present

## 2022-05-22 DIAGNOSIS — E1165 Type 2 diabetes mellitus with hyperglycemia: Secondary | ICD-10-CM | POA: Diagnosis not present

## 2022-05-22 DIAGNOSIS — K7581 Nonalcoholic steatohepatitis (NASH): Secondary | ICD-10-CM | POA: Diagnosis not present

## 2022-05-22 DIAGNOSIS — E039 Hypothyroidism, unspecified: Secondary | ICD-10-CM | POA: Diagnosis not present

## 2022-05-26 DIAGNOSIS — E1169 Type 2 diabetes mellitus with other specified complication: Secondary | ICD-10-CM | POA: Diagnosis not present

## 2022-05-26 DIAGNOSIS — D171 Benign lipomatous neoplasm of skin and subcutaneous tissue of trunk: Secondary | ICD-10-CM | POA: Diagnosis not present

## 2022-05-26 DIAGNOSIS — R030 Elevated blood-pressure reading, without diagnosis of hypertension: Secondary | ICD-10-CM | POA: Diagnosis not present

## 2022-05-26 DIAGNOSIS — K7581 Nonalcoholic steatohepatitis (NASH): Secondary | ICD-10-CM | POA: Diagnosis not present

## 2022-05-26 DIAGNOSIS — Z0001 Encounter for general adult medical examination with abnormal findings: Secondary | ICD-10-CM | POA: Diagnosis not present

## 2022-05-26 DIAGNOSIS — E1165 Type 2 diabetes mellitus with hyperglycemia: Secondary | ICD-10-CM | POA: Diagnosis not present

## 2022-05-26 DIAGNOSIS — I25118 Atherosclerotic heart disease of native coronary artery with other forms of angina pectoris: Secondary | ICD-10-CM | POA: Diagnosis not present

## 2022-05-26 DIAGNOSIS — E7849 Other hyperlipidemia: Secondary | ICD-10-CM | POA: Diagnosis not present

## 2022-06-18 DIAGNOSIS — S338XXA Sprain of other parts of lumbar spine and pelvis, initial encounter: Secondary | ICD-10-CM | POA: Diagnosis not present

## 2022-06-18 DIAGNOSIS — M9903 Segmental and somatic dysfunction of lumbar region: Secondary | ICD-10-CM | POA: Diagnosis not present

## 2022-06-19 DIAGNOSIS — S338XXA Sprain of other parts of lumbar spine and pelvis, initial encounter: Secondary | ICD-10-CM | POA: Diagnosis not present

## 2022-06-19 DIAGNOSIS — M9903 Segmental and somatic dysfunction of lumbar region: Secondary | ICD-10-CM | POA: Diagnosis not present

## 2022-06-22 DIAGNOSIS — M9903 Segmental and somatic dysfunction of lumbar region: Secondary | ICD-10-CM | POA: Diagnosis not present

## 2022-06-22 DIAGNOSIS — S338XXA Sprain of other parts of lumbar spine and pelvis, initial encounter: Secondary | ICD-10-CM | POA: Diagnosis not present

## 2022-06-23 DIAGNOSIS — S338XXA Sprain of other parts of lumbar spine and pelvis, initial encounter: Secondary | ICD-10-CM | POA: Diagnosis not present

## 2022-06-23 DIAGNOSIS — M9903 Segmental and somatic dysfunction of lumbar region: Secondary | ICD-10-CM | POA: Diagnosis not present

## 2022-06-24 DIAGNOSIS — S39012A Strain of muscle, fascia and tendon of lower back, initial encounter: Secondary | ICD-10-CM | POA: Diagnosis not present

## 2022-06-25 DIAGNOSIS — M9903 Segmental and somatic dysfunction of lumbar region: Secondary | ICD-10-CM | POA: Diagnosis not present

## 2022-06-25 DIAGNOSIS — S338XXA Sprain of other parts of lumbar spine and pelvis, initial encounter: Secondary | ICD-10-CM | POA: Diagnosis not present

## 2022-06-26 DIAGNOSIS — S338XXA Sprain of other parts of lumbar spine and pelvis, initial encounter: Secondary | ICD-10-CM | POA: Diagnosis not present

## 2022-06-26 DIAGNOSIS — M9903 Segmental and somatic dysfunction of lumbar region: Secondary | ICD-10-CM | POA: Diagnosis not present

## 2022-07-07 DIAGNOSIS — E113291 Type 2 diabetes mellitus with mild nonproliferative diabetic retinopathy without macular edema, right eye: Secondary | ICD-10-CM | POA: Diagnosis not present

## 2022-07-07 DIAGNOSIS — H43823 Vitreomacular adhesion, bilateral: Secondary | ICD-10-CM | POA: Diagnosis not present

## 2022-07-07 DIAGNOSIS — D3132 Benign neoplasm of left choroid: Secondary | ICD-10-CM | POA: Diagnosis not present

## 2022-07-07 DIAGNOSIS — H35033 Hypertensive retinopathy, bilateral: Secondary | ICD-10-CM | POA: Diagnosis not present

## 2022-08-03 ENCOUNTER — Ambulatory Visit: Payer: Medicare Other | Attending: Internal Medicine | Admitting: Internal Medicine

## 2022-08-03 ENCOUNTER — Encounter: Payer: Self-pay | Admitting: Internal Medicine

## 2022-08-03 VITALS — BP 124/80 | HR 64 | Ht 75.0 in | Wt 228.2 lb

## 2022-08-03 DIAGNOSIS — I10 Essential (primary) hypertension: Secondary | ICD-10-CM | POA: Diagnosis not present

## 2022-08-03 DIAGNOSIS — I2510 Atherosclerotic heart disease of native coronary artery without angina pectoris: Secondary | ICD-10-CM | POA: Diagnosis not present

## 2022-08-03 NOTE — Patient Instructions (Signed)
Medication Instructions:  Your physician recommends that you continue on your current medications as directed. Please refer to the Current Medication list given to you today.  Labwork: none  Testing/Procedures: none  Follow-Up: Your physician recommends that you schedule a follow-up appointment in: 2 years. You will receive a reminder call in the mail in about 20 months reminding you to call and schedule your appointment. If you don't receive this call, please contact our office.  Any Other Special Instructions Will Be Listed Below (If Applicable).  If you need a refill on your cardiac medications before your next appointment, please call your pharmacy.

## 2022-08-03 NOTE — Progress Notes (Signed)
Cardiology Office Note  Date: 08/03/2022   ID: Daniel Mcgee, DOB March 13, 1961, MRN [id number]  PCP:  Caryl Bis, MD  Cardiologist:  Chalmers Guest, MD Electrophysiologist:  None   Reason for Office Visit: Follow-up of CAD   History of Present Illness: Daniel Mcgee is a 62 y.o. male known to have CAD manifested by STEMI in 2019 s/p LCx PCI with normal LVEF, HTN, DM 2, HLD presented to the cardiology clinic for follow-up visit.  No interval ER visits or hospitalizations for chest pain. Denies any angina, DOE, dizziness, lightheadedness, syncope, palpitations and claudication. Denies smoking cigarettes.  Past Medical History:  Diagnosis Date   Diabetes mellitus without complication ([place])    Family history of premature CAD    HLD (hyperlipidemia)    HTN (hypertension)    Hyperlipemia    STEMI (ST elevation myocardial infarction) ([place])    2/19 PCI/DES x1 to Lcx, mild disease in RCA, LAD, normal EF    Past Surgical History:  Procedure Laterality Date   COLONOSCOPY WITH PROPOFOL N/A 05/14/2021   Procedure: COLONOSCOPY WITH PROPOFOL;  Surgeon: Harvel Quale, MD;  Location: [facility];  Service: Gastroenterology;  Laterality: N/A;  8:15   CORONARY STENT PLACEMENT     CORONARY/GRAFT ACUTE MI REVASCULARIZATION N/A 06/22/2017   Procedure: Coronary/Graft Acute MI Revascularization;  Surgeon: Sherren Mocha, MD;  Location: [facility];  Service: Cardiovascular;  Laterality: N/A;   INGUINAL HERNIA REPAIR Right 2016   LEFT HEART CATH AND CORONARY ANGIOGRAPHY N/A 06/22/2017   Procedure: LEFT HEART CATH AND CORONARY ANGIOGRAPHY;  Surgeon: Sherren Mocha, MD;  Location: [facility];  Service: Cardiovascular;  Laterality: N/A;   POLYPECTOMY  05/14/2021   Procedure: POLYPECTOMY;  Surgeon: Montez Morita, Quillian Quince, MD;  Location: [facility];  Service: Gastroenterology;;    Current Outpatient Medications  Medication Sig Dispense Refill    acetaminophen (TYLENOL) 500 MG tablet Take 1,000 mg by mouth every 6 (six) hours as needed for moderate pain or headache.     aspirin EC 81 MG tablet Take 81 mg by mouth daily.     atorvastatin (LIPITOR) 80 MG tablet TAKE ONE TABLET BY MOUTH DAILY AT 6 PM 90 tablet 1   calcium carbonate (TUMS - DOSED IN MG ELEMENTAL CALCIUM) 500 MG chewable tablet Chew 1 tablet by mouth daily as needed for indigestion or heartburn.     glipiZIDE (GLUCOTROL XL) 10 MG 24 hr tablet Take 10 mg by mouth daily.     metoprolol succinate (TOPROL-XL) 25 MG 24 hr tablet Take 25 mg by mouth daily.     Multiple Vitamin (MULTIVITAMIN) tablet Take 1 tablet by mouth daily.     nitroGLYCERIN (NITROSTAT) 0.4 MG SL tablet Place 1 tablet (0.4 mg total) under the tongue every 5 (five) minutes x 3 doses as needed for chest pain. 25 tablet 3   No current facility-administered medications for this visit.   Allergies:  Hydrocodone   Social History: The patient  reports that he has never smoked. He quit smokeless tobacco use about 38 years ago.  His smokeless tobacco use included chew. He reports current alcohol use of about 1.0 standard drink of alcohol per week. He reports that he does not use drugs.   Family History: The patient's family history includes CAD in his brother; CAD (age of onset: 22) in his brother; Heart attack in his mother.   ROS:  Please see the history of present illness. Otherwise,  complete review of systems is positive for none.  All other systems are reviewed and negative.   Physical Exam: VS:  BP 124/80   Pulse 64   Ht 6\' 3"  (1.905 m)   Wt 228 lb 3.2 oz (103.5 kg)   SpO2 95%   BMI 28.52 kg/m , BMI Body mass index is 28.52 kg/m.  Wt Readings from Last 3 Encounters:  08/03/22 228 lb 3.2 oz (103.5 kg)  05/14/21 213 lb (96.6 kg)  04/14/21 221 lb 11.2 oz (100.6 kg)    General: Patient appears comfortable at rest. HEENT: Conjunctiva and lids normal, oropharynx clear with moist mucosa. Neck: Supple, no  elevated JVP or carotid bruits, no thyromegaly. Lungs: Clear to auscultation, nonlabored breathing at rest. Cardiac: Regular rate and rhythm, no S3 or significant systolic murmur, no pericardial rub. Abdomen: Soft, nontender, no hepatomegaly, bowel sounds present, no guarding or rebound. Extremities: No pitting edema, distal pulses 2+. Skin: Warm and dry. Musculoskeletal: No kyphosis. Neuropsychiatric: Alert and oriented x3, affect grossly appropriate.  ECG:  NSR  Recent Labwork: No results found for requested labs within last 365 days.     Component Value Date/Time   CHOL 137 06/23/2017 0241   TRIG 104 06/23/2017 0241   HDL 47 06/23/2017 0241   CHOLHDL 2.9 06/23/2017 0241   VLDL 21 06/23/2017 0241   LDLCALC 69 06/23/2017 0241    Other Studies Reviewed Today: Echocardiogram in 06/2017 LVEF normal G2 DD No valve abnormalities  LHC in 06/2017 LCx PCI, mild nonobstructive disease of the LAD and RCA  Assessment and Plan: Patient is a 62 year old M known to have CAD manifested by STEMI in 2019 s/p LCx PCI with normal LVEF, HTN, DM 2, HLD presented to the cardiology clinic for follow-up visit.  # CAD manifested by STEMI in 2019 s/p LCx PCI with normal LVEF, currently angina free -Continue aspirin 81 mg once daily -Continue atorvastatin 80 mg nightly -SL NTG 0.4 mg as needed -ER precautions for chest pain  # HTN, controlled -Continue metoprolol succinate 25 mg once daily  # HLD -Continue atorvastatin 80 mg nightly. Goal LDL is 70.   I have spent a total of 33 minutes with patient reviewing chart, EKGs, labs and examining patient as well as establishing an assessment and plan that was discussed with the patient.  > 50% of time was spent in direct patient care.    Medication Adjustments/Labs and Tests Ordered: Current medicines are reviewed at length with the patient today.  Concerns regarding medicines are outlined above.   Tests Ordered: Orders Placed This Encounter   Procedures   EKG 12-Lead    Medication Changes: No orders of the defined types were placed in this encounter.   Disposition:  Follow up  2 years  Signed Enyah Moman Fidel Levy, MD, 08/03/2022 1:44 PM    [facility] [place], [place], [place] [address]

## 2022-11-02 DIAGNOSIS — E7849 Other hyperlipidemia: Secondary | ICD-10-CM | POA: Diagnosis not present

## 2022-11-02 DIAGNOSIS — E1169 Type 2 diabetes mellitus with other specified complication: Secondary | ICD-10-CM | POA: Diagnosis not present

## 2022-11-02 DIAGNOSIS — E782 Mixed hyperlipidemia: Secondary | ICD-10-CM | POA: Diagnosis not present

## 2022-11-02 DIAGNOSIS — I10 Essential (primary) hypertension: Secondary | ICD-10-CM | POA: Diagnosis not present

## 2022-11-04 DIAGNOSIS — E1165 Type 2 diabetes mellitus with hyperglycemia: Secondary | ICD-10-CM | POA: Diagnosis not present

## 2022-11-04 DIAGNOSIS — K7581 Nonalcoholic steatohepatitis (NASH): Secondary | ICD-10-CM | POA: Diagnosis not present

## 2022-11-04 DIAGNOSIS — I25118 Atherosclerotic heart disease of native coronary artery with other forms of angina pectoris: Secondary | ICD-10-CM | POA: Diagnosis not present

## 2022-11-04 DIAGNOSIS — E1169 Type 2 diabetes mellitus with other specified complication: Secondary | ICD-10-CM | POA: Diagnosis not present

## 2022-11-04 DIAGNOSIS — D171 Benign lipomatous neoplasm of skin and subcutaneous tissue of trunk: Secondary | ICD-10-CM | POA: Diagnosis not present

## 2022-11-04 DIAGNOSIS — E7849 Other hyperlipidemia: Secondary | ICD-10-CM | POA: Diagnosis not present

## 2022-11-04 DIAGNOSIS — R030 Elevated blood-pressure reading, without diagnosis of hypertension: Secondary | ICD-10-CM | POA: Diagnosis not present

## 2023-01-05 DIAGNOSIS — D3132 Benign neoplasm of left choroid: Secondary | ICD-10-CM | POA: Diagnosis not present

## 2023-01-05 DIAGNOSIS — H43823 Vitreomacular adhesion, bilateral: Secondary | ICD-10-CM | POA: Diagnosis not present

## 2023-01-05 DIAGNOSIS — E113291 Type 2 diabetes mellitus with mild nonproliferative diabetic retinopathy without macular edema, right eye: Secondary | ICD-10-CM | POA: Diagnosis not present

## 2023-01-05 DIAGNOSIS — H35033 Hypertensive retinopathy, bilateral: Secondary | ICD-10-CM | POA: Diagnosis not present

## 2023-01-16 ENCOUNTER — Emergency Department (HOSPITAL_COMMUNITY)
Admission: EM | Admit: 2023-01-16 | Discharge: 2023-01-16 | Disposition: A | Discharge status: Home / Self Care | Payer: Medicare Other | Attending: Emergency Medicine | Admitting: Emergency Medicine

## 2023-01-16 ENCOUNTER — Emergency Department (HOSPITAL_COMMUNITY): Payer: Medicare Other

## 2023-01-16 ENCOUNTER — Encounter (HOSPITAL_COMMUNITY): Payer: Self-pay | Admitting: Emergency Medicine

## 2023-01-16 ENCOUNTER — Other Ambulatory Visit: Payer: Self-pay

## 2023-01-16 DIAGNOSIS — W11XXXA Fall on and from ladder, initial encounter: Secondary | ICD-10-CM | POA: Insufficient documentation

## 2023-01-16 DIAGNOSIS — Z7982 Long term (current) use of aspirin: Secondary | ICD-10-CM | POA: Insufficient documentation

## 2023-01-16 DIAGNOSIS — S098XXA Other specified injuries of head, initial encounter: Secondary | ICD-10-CM | POA: Diagnosis not present

## 2023-01-16 DIAGNOSIS — I10 Essential (primary) hypertension: Secondary | ICD-10-CM | POA: Diagnosis not present

## 2023-01-16 DIAGNOSIS — S0083XA Contusion of other part of head, initial encounter: Secondary | ICD-10-CM | POA: Insufficient documentation

## 2023-01-16 DIAGNOSIS — S0990XA Unspecified injury of head, initial encounter: Secondary | ICD-10-CM | POA: Diagnosis not present

## 2023-01-16 DIAGNOSIS — R9389 Abnormal findings on diagnostic imaging of other specified body structures: Secondary | ICD-10-CM | POA: Insufficient documentation

## 2023-01-16 MED ORDER — NAPROXEN 500 MG PO TABS: 500.0000 mg | ORAL_TABLET | Freq: Two times a day (BID) | ORAL | 0 refills | Status: AC

## 2023-01-16 MED ORDER — BACITRACIN ZINC 500 UNIT/GM EX OINT
TOPICAL_OINTMENT | Freq: Two times a day (BID) | CUTANEOUS | Status: DC
  Filled 2023-01-16: qty 0.9

## 2023-01-16 NOTE — Discharge Instructions (Addendum)
The x-ray of your brain looks good, the one of your spine shows that there might be a very tiny fracture but it is unable to tell because of the quality of the CT scan.  This is something that you will definitely need to follow-up with the neurosurgeon about an you will need to wear the collar on your neck until you see them this week.  Please call Monday morning for an appointment.  Naprosyn twice a day as needed for pain, ER for severe worsening pain weakness or numbness  You can have your family doctor order an MRI of your cervical spine, if this shows no fracture you can take the collar off and not worry about it.  If you can see the spine specialist before you leave for the beach that would be fine as well

## 2023-01-16 NOTE — ED Provider Notes (Signed)
[facility] AT [facility] Provider Note   CSN: [id number] Arrival date & time: 01/16/23  1944     History  Chief Complaint  Patient presents with   Daniel Mcgee is a 62 y.o. male.   Fall   This patient is a 62 year old male, he has a history of hypertension but no other significant medical problems, he does take glipizide as well.  He presents to the hospital today with a complaint of a head injury which occurred 5 hours prior to arrival when he was on a 5 foot ladder, he fell from the ladder striking his head against the building that he was leaning up against.  He did feel like he had a mild headache at the time but now is completely asymptomatic except for the lump on his head and a feeling of a burning sensation between his shoulder blades.  He denies any symptoms down his arms or his legs and has been able to ambulate without difficulty, no chest pain or shortness of breath, wound care provided at home prior to arrival    Home Medications Prior to Admission medications   Medication Sig Start Date End Date Taking? Authorizing Provider  naproxen (NAPROSYN) 500 MG tablet Take 1 tablet (500 mg total) by mouth 2 (two) times daily with a meal. 01/16/23  Yes Eber Hong, MD  acetaminophen (TYLENOL) 500 MG tablet Take 1,000 mg by mouth every 6 (six) hours as needed for moderate pain or headache.    [provider]  aspirin EC 81 MG tablet Take 81 mg by mouth daily.    [provider]  atorvastatin (LIPITOR) 80 MG tablet TAKE ONE TABLET BY MOUTH DAILY AT 6 PM 03/21/18   Laqueta Linden, MD  calcium carbonate (TUMS - DOSED IN MG ELEMENTAL CALCIUM) 500 MG chewable tablet Chew 1 tablet by mouth daily as needed for indigestion or heartburn.    [provider]  glipiZIDE (GLUCOTROL XL) 10 MG 24 hr tablet Take 10 mg by mouth daily. 05/28/22   [provider]  metoprolol succinate (TOPROL-XL) 25 MG 24 hr tablet  Take 25 mg by mouth daily. 03/19/21   [provider]  Multiple Vitamin (MULTIVITAMIN) tablet Take 1 tablet by mouth daily.    [provider]  nitroGLYCERIN (NITROSTAT) 0.4 MG SL tablet Place 1 tablet (0.4 mg total) under the tongue every 5 (five) minutes x 3 doses as needed for chest pain. 06/20/18   Laqueta Linden, MD      Allergies    Hydrocodone    Review of Systems   Review of Systems  All other systems reviewed and are negative.   Physical Exam Updated Vital Signs BP 127/85 (BP Location: Right Arm)   Pulse 72   Temp 99.2 F (37.3 C) (Oral)   Resp 14   Ht 1.905 m (6\' 3" )   Wt 99.8 kg   SpO2 99%   BMI 27.50 kg/m  Physical Exam Vitals and nursing note reviewed.  Constitutional:      General: He is not in acute distress.    Appearance: He is well-developed.  HENT:     Head: Normocephalic.     Comments: Small skin tear to the forehead, contusion present to the crown of the head and top of the head    Mouth/Throat:     Pharynx: No oropharyngeal exudate.  Eyes:     General: No scleral icterus.  Right eye: No discharge.        Left eye: No discharge.     Conjunctiva/sclera: Conjunctivae normal.     Pupils: Pupils are equal, round, and reactive to light.  Neck:     Thyroid: No thyromegaly.     Vascular: No JVD.  Cardiovascular:     Rate and Rhythm: Normal rate and regular rhythm.     Heart sounds: Normal heart sounds. No murmur heard.    No friction rub. No gallop.  Pulmonary:     Effort: Pulmonary effort is normal. No respiratory distress.     Breath sounds: Normal breath sounds. No wheezing or rales.  Abdominal:     General: Bowel sounds are normal. There is no distension.     Palpations: Abdomen is soft. There is no mass.     Tenderness: There is no abdominal tenderness.  Musculoskeletal:        General: No tenderness. Normal range of motion.     Cervical back: Normal range of motion and neck supple.     Comments: No tenderness  over the spine or the extremities  Lymphadenopathy:     Cervical: No cervical adenopathy.  Skin:    General: Skin is warm and dry.     Findings: No erythema or rash.  Neurological:     Mental Status: He is alert.     Coordination: Coordination normal.     Comments: Normal gait, normal speech, cranial nerves III through XII are normal, normal strength and sensation in the bilateral upper and lower extremities  Psychiatric:        Behavior: Behavior normal.     ED Results / Procedures / Treatments   Labs (all labs ordered are listed, but only abnormal results are displayed) Labs Reviewed - No data to display  EKG None  Radiology CT Head Wo Contrast  Result Date: 01/16/2023 CLINICAL DATA:  Larey Seat off 5 foot ladder and hit head on side of building abrasion and swelling top of forehead EXAM: CT HEAD WITHOUT CONTRAST CT CERVICAL SPINE WITHOUT CONTRAST TECHNIQUE: Multidetector CT imaging of the head and cervical spine was performed following the standard protocol without intravenous contrast. Multiplanar CT image reconstructions of the cervical spine were also generated. RADIATION DOSE REDUCTION: This exam was performed according to the departmental dose-optimization program which includes automated exposure control, adjustment of the mA and/or kV according to patient size and/or use of iterative reconstruction technique. COMPARISON:  MRI 02/20/2020, CT brain 05/17/2015 reports FINDINGS: CT HEAD FINDINGS Brain: No acute territorial infarction, hemorrhage mass. Encephalomalacia in the right anterior periventricular white matter with ex vacuo enlargement of right frontal horn. Vascular: No hyperdense vessels.  No unexpected calcification Skull: Normal. Negative for fracture or focal lesion. Sinuses/Orbits: No acute finding. Other: None CT CERVICAL SPINE FINDINGS Alignment: Straightening of the cervical spine. Trace anterolisthesis C3 on C4 and C4 on C5. Facet alignment is maintained Skull base and  vertebrae: Vertebral body heights are maintained. Cranio vertebral junction is intact. Questionable linear lucency slashed nondisplaced fracture at the right pars and inferior facet of C2 on sagittal series 5 image 26 through 28, but limited by streak artifact. Soft tissues and spinal canal: No prevertebral fluid or swelling. No visible canal hematoma. Disc levels: Partial ankylosis C2-C3. Fusion of the facets at C2-C3. Moderate severe disc space narrowing C5-C6 and C6-C7. Left greater than right hypertrophic facet degenerative changes with multilevel foraminal narrowing. Upper chest: Negative. Other: None IMPRESSION: 1. No CT evidence for acute intracranial abnormality.  Encephalomalacia in the right anterior periventricular white matter with ex vacuo dilatation of right frontal horn. 2. Straightening of the cervical spine with multilevel degenerative changes. Questionable lucency/nondisplaced fracture involving the right pars/inferior facet at C2 on sagittal views but limited by abundant streak artifact. Electronically Signed   By: Jasmine Pang M.D.   On: 01/16/2023 22:00   CT Cervical Spine Wo Contrast  Result Date: 01/16/2023 CLINICAL DATA:  Larey Seat off 5 foot ladder and hit head on side of building abrasion and swelling top of forehead EXAM: CT HEAD WITHOUT CONTRAST CT CERVICAL SPINE WITHOUT CONTRAST TECHNIQUE: Multidetector CT imaging of the head and cervical spine was performed following the standard protocol without intravenous contrast. Multiplanar CT image reconstructions of the cervical spine were also generated. RADIATION DOSE REDUCTION: This exam was performed according to the departmental dose-optimization program which includes automated exposure control, adjustment of the mA and/or kV according to patient size and/or use of iterative reconstruction technique. COMPARISON:  MRI 02/20/2020, CT brain 05/17/2015 reports FINDINGS: CT HEAD FINDINGS Brain: No acute territorial infarction, hemorrhage mass.  Encephalomalacia in the right anterior periventricular white matter with ex vacuo enlargement of right frontal horn. Vascular: No hyperdense vessels.  No unexpected calcification Skull: Normal. Negative for fracture or focal lesion. Sinuses/Orbits: No acute finding. Other: None CT CERVICAL SPINE FINDINGS Alignment: Straightening of the cervical spine. Trace anterolisthesis C3 on C4 and C4 on C5. Facet alignment is maintained Skull base and vertebrae: Vertebral body heights are maintained. Cranio vertebral junction is intact. Questionable linear lucency slashed nondisplaced fracture at the right pars and inferior facet of C2 on sagittal series 5 image 26 through 28, but limited by streak artifact. Soft tissues and spinal canal: No prevertebral fluid or swelling. No visible canal hematoma. Disc levels: Partial ankylosis C2-C3. Fusion of the facets at C2-C3. Moderate severe disc space narrowing C5-C6 and C6-C7. Left greater than right hypertrophic facet degenerative changes with multilevel foraminal narrowing. Upper chest: Negative. Other: None IMPRESSION: 1. No CT evidence for acute intracranial abnormality. Encephalomalacia in the right anterior periventricular white matter with ex vacuo dilatation of right frontal horn. 2. Straightening of the cervical spine with multilevel degenerative changes. Questionable lucency/nondisplaced fracture involving the right pars/inferior facet at C2 on sagittal views but limited by abundant streak artifact. Electronically Signed   By: Jasmine Pang M.D.   On: 01/16/2023 22:00    Procedures Procedures    Medications Ordered in ED Medications  bacitracin ointment ( Topical Given 01/16/23 2134)    ED Course/ Medical Decision Making/ A&P                                 Medical Decision Making Amount and/or Complexity of Data Reviewed Radiology: ordered.  Risk OTC drugs. Prescription drug management.    This patient presents to the ED for concern of head injury  with some burning in his back differential diagnosis includes no injury, head injury, fracture, traumatic brain injury, bleeding    Additional history obtained:  Additional history obtained from spouse at the bedside External records from outside source obtained and reviewed including accurate description of the events of the day   Lab Tests:  I Ordered, and personally interpreted labs.  The pertinent results include: Not indicated   Imaging Studies ordered:  I ordered imaging studies including scan of the head and cervical spine I independently visualized and interpreted imaging which showed CT scan of the brain  unremarkable except for some hydrocephalus ex vacuo, CT scan of the cervical spine shows that there is a possible C2 lucency consistent with a fracture but has no neurologic symptoms to suggest that this is unstable, it is even something that could be clouded by movement artifact. I agree with the radiologist interpretation   Medicines ordered and prescription drug management:  I ordered medication including Naprosyn for pain, cervical collar for C-spine finding Reevaluation of the patient after these medicines showed that the patient stable I have reviewed the patients home medicines and have made adjustments as needed   Problem List / ED Course:  Patient expresses understanding to the results and the need for close follow-up, given neurosurgical phone number   Social Determinants of Health:             Final Clinical Impression(s) / ED Diagnoses Final diagnoses:  Minor head injury, initial encounter  Abnormal CT scan    Rx / DC Orders ED Discharge Orders          Ordered    naproxen (NAPROSYN) 500 MG tablet  2 times daily with meals        01/16/23 2210              Eber Hong, MD 01/16/23 2212

## 2023-01-16 NOTE — ED Triage Notes (Addendum)
Pt states he fell off of a 1ft ladder hitting his head on the side of the building. Pt with abrasion and swelling to top of forehead. Pt unsure whether he lost consciousness or not. Pt with c/o "tightness between shoulder blades". Denies headache. States he took 2 Tylenol "immediately after it happened". Pt alert and oriented in triage.

## 2023-01-18 DIAGNOSIS — S0003XA Contusion of scalp, initial encounter: Secondary | ICD-10-CM | POA: Diagnosis not present

## 2023-01-18 DIAGNOSIS — S161XXA Strain of muscle, fascia and tendon at neck level, initial encounter: Secondary | ICD-10-CM | POA: Diagnosis not present

## 2023-03-03 DIAGNOSIS — K219 Gastro-esophageal reflux disease without esophagitis: Secondary | ICD-10-CM | POA: Diagnosis not present

## 2023-03-03 DIAGNOSIS — I10 Essential (primary) hypertension: Secondary | ICD-10-CM | POA: Diagnosis not present

## 2023-03-03 DIAGNOSIS — E559 Vitamin D deficiency, unspecified: Secondary | ICD-10-CM | POA: Diagnosis not present

## 2023-03-03 DIAGNOSIS — E7849 Other hyperlipidemia: Secondary | ICD-10-CM | POA: Diagnosis not present

## 2023-03-03 DIAGNOSIS — E1169 Type 2 diabetes mellitus with other specified complication: Secondary | ICD-10-CM | POA: Diagnosis not present

## 2023-03-04 DIAGNOSIS — E782 Mixed hyperlipidemia: Secondary | ICD-10-CM | POA: Diagnosis not present

## 2023-03-04 DIAGNOSIS — I10 Essential (primary) hypertension: Secondary | ICD-10-CM | POA: Diagnosis not present

## 2023-03-09 DIAGNOSIS — I25118 Atherosclerotic heart disease of native coronary artery with other forms of angina pectoris: Secondary | ICD-10-CM | POA: Diagnosis not present

## 2023-03-09 DIAGNOSIS — E1165 Type 2 diabetes mellitus with hyperglycemia: Secondary | ICD-10-CM | POA: Diagnosis not present

## 2023-03-09 DIAGNOSIS — D171 Benign lipomatous neoplasm of skin and subcutaneous tissue of trunk: Secondary | ICD-10-CM | POA: Diagnosis not present

## 2023-03-09 DIAGNOSIS — E7849 Other hyperlipidemia: Secondary | ICD-10-CM | POA: Diagnosis not present

## 2023-03-09 DIAGNOSIS — R030 Elevated blood-pressure reading, without diagnosis of hypertension: Secondary | ICD-10-CM | POA: Diagnosis not present

## 2023-03-09 DIAGNOSIS — Z23 Encounter for immunization: Secondary | ICD-10-CM | POA: Diagnosis not present

## 2023-03-09 DIAGNOSIS — K7581 Nonalcoholic steatohepatitis (NASH): Secondary | ICD-10-CM | POA: Diagnosis not present

## 2023-06-09 DIAGNOSIS — D72829 Elevated white blood cell count, unspecified: Secondary | ICD-10-CM | POA: Diagnosis not present

## 2023-06-09 DIAGNOSIS — Z1329 Encounter for screening for other suspected endocrine disorder: Secondary | ICD-10-CM | POA: Diagnosis not present

## 2023-06-09 DIAGNOSIS — E7849 Other hyperlipidemia: Secondary | ICD-10-CM | POA: Diagnosis not present

## 2023-06-09 DIAGNOSIS — I10 Essential (primary) hypertension: Secondary | ICD-10-CM | POA: Diagnosis not present

## 2023-06-09 DIAGNOSIS — E039 Hypothyroidism, unspecified: Secondary | ICD-10-CM | POA: Diagnosis not present

## 2023-06-09 DIAGNOSIS — R739 Hyperglycemia, unspecified: Secondary | ICD-10-CM | POA: Diagnosis not present

## 2023-06-09 DIAGNOSIS — E559 Vitamin D deficiency, unspecified: Secondary | ICD-10-CM | POA: Diagnosis not present

## 2023-06-09 DIAGNOSIS — E1165 Type 2 diabetes mellitus with hyperglycemia: Secondary | ICD-10-CM | POA: Diagnosis not present

## 2023-06-09 DIAGNOSIS — K7581 Nonalcoholic steatohepatitis (NASH): Secondary | ICD-10-CM | POA: Diagnosis not present

## 2023-06-09 DIAGNOSIS — Z131 Encounter for screening for diabetes mellitus: Secondary | ICD-10-CM | POA: Diagnosis not present

## 2023-06-09 DIAGNOSIS — Z0001 Encounter for general adult medical examination with abnormal findings: Secondary | ICD-10-CM | POA: Diagnosis not present

## 2023-06-09 DIAGNOSIS — Z1321 Encounter for screening for nutritional disorder: Secondary | ICD-10-CM | POA: Diagnosis not present

## 2023-06-09 DIAGNOSIS — E782 Mixed hyperlipidemia: Secondary | ICD-10-CM | POA: Diagnosis not present

## 2023-06-16 DIAGNOSIS — R030 Elevated blood-pressure reading, without diagnosis of hypertension: Secondary | ICD-10-CM | POA: Diagnosis not present

## 2023-06-16 DIAGNOSIS — E782 Mixed hyperlipidemia: Secondary | ICD-10-CM | POA: Diagnosis not present

## 2023-06-16 DIAGNOSIS — E7849 Other hyperlipidemia: Secondary | ICD-10-CM | POA: Diagnosis not present

## 2023-06-16 DIAGNOSIS — Z0001 Encounter for general adult medical examination with abnormal findings: Secondary | ICD-10-CM | POA: Diagnosis not present

## 2023-06-16 DIAGNOSIS — E1165 Type 2 diabetes mellitus with hyperglycemia: Secondary | ICD-10-CM | POA: Diagnosis not present

## 2023-06-16 DIAGNOSIS — K7581 Nonalcoholic steatohepatitis (NASH): Secondary | ICD-10-CM | POA: Diagnosis not present

## 2023-06-16 DIAGNOSIS — I10 Essential (primary) hypertension: Secondary | ICD-10-CM | POA: Diagnosis not present

## 2023-10-19 DIAGNOSIS — R21 Rash and other nonspecific skin eruption: Secondary | ICD-10-CM | POA: Diagnosis not present

## 2023-12-28 DIAGNOSIS — Z0001 Encounter for general adult medical examination with abnormal findings: Secondary | ICD-10-CM | POA: Diagnosis not present

## 2023-12-28 DIAGNOSIS — R7309 Other abnormal glucose: Secondary | ICD-10-CM | POA: Diagnosis not present

## 2023-12-28 DIAGNOSIS — E7849 Other hyperlipidemia: Secondary | ICD-10-CM | POA: Diagnosis not present

## 2023-12-28 DIAGNOSIS — E1165 Type 2 diabetes mellitus with hyperglycemia: Secondary | ICD-10-CM | POA: Diagnosis not present

## 2023-12-28 DIAGNOSIS — I10 Essential (primary) hypertension: Secondary | ICD-10-CM | POA: Diagnosis not present

## 2024-01-04 DIAGNOSIS — K7581 Nonalcoholic steatohepatitis (NASH): Secondary | ICD-10-CM | POA: Diagnosis not present

## 2024-01-04 DIAGNOSIS — E782 Mixed hyperlipidemia: Secondary | ICD-10-CM | POA: Diagnosis not present

## 2024-01-04 DIAGNOSIS — I10 Essential (primary) hypertension: Secondary | ICD-10-CM | POA: Diagnosis not present

## 2024-01-04 DIAGNOSIS — E7849 Other hyperlipidemia: Secondary | ICD-10-CM | POA: Diagnosis not present

## 2024-01-04 DIAGNOSIS — E1165 Type 2 diabetes mellitus with hyperglycemia: Secondary | ICD-10-CM | POA: Diagnosis not present

## 2024-02-25 DIAGNOSIS — S61011A Laceration without foreign body of right thumb without damage to nail, initial encounter: Secondary | ICD-10-CM | POA: Diagnosis not present
